# Patient Record
Sex: Female | Born: 1955 | Race: Black or African American | Hispanic: No | Smoking: Current every day smoker
Health system: Southern US, Community
[De-identification: ages and names within clinical notes are randomized; demographics above are authoritative.]

## PROBLEM LIST (undated history)

## (undated) DIAGNOSIS — D696 Thrombocytopenia, unspecified: Secondary | ICD-10-CM

## (undated) HISTORY — PX: ABDOMINAL HYSTERECTOMY: SHX81

## (undated) HISTORY — DX: Thrombocytopenia, unspecified: D69.6

## (undated) HISTORY — PX: SALPINGECTOMY: SHX328

---

## 2004-05-19 ENCOUNTER — Emergency Department (HOSPITAL_COMMUNITY): Admission: EM | Admit: 2004-05-19 | Discharge: 2004-05-19 | Payer: Self-pay | Admitting: *Deleted

## 2005-09-15 ENCOUNTER — Ambulatory Visit: Payer: Self-pay

## 2005-11-09 DIAGNOSIS — D6949 Other primary thrombocytopenia: Secondary | ICD-10-CM | POA: Insufficient documentation

## 2006-11-03 ENCOUNTER — Ambulatory Visit: Payer: Self-pay | Admitting: Family Medicine

## 2006-11-14 ENCOUNTER — Ambulatory Visit: Payer: Self-pay

## 2006-11-18 DIAGNOSIS — R079 Chest pain, unspecified: Secondary | ICD-10-CM | POA: Insufficient documentation

## 2010-09-02 LAB — CBC AND DIFFERENTIAL
HCT: 38 % (ref 36–46)
HEMOGLOBIN: 13.4 g/dL (ref 12.0–16.0)
Platelets: 148 10*3/uL — AB (ref 150–399)
WBC: 4.5 10*3/mL

## 2010-09-02 LAB — BASIC METABOLIC PANEL
BUN: 12 mg/dL (ref 4–21)
Creatinine: 0.7 mg/dL (ref 0.5–1.1)
Glucose: 93 mg/dL
Potassium: 4.1 mmol/L (ref 3.4–5.3)
Sodium: 140 mmol/L (ref 137–147)

## 2010-09-02 LAB — HEPATIC FUNCTION PANEL
ALT: 19 U/L (ref 7–35)
AST: 18 U/L (ref 13–35)

## 2010-09-02 LAB — TSH: TSH: 1.2 u[IU]/mL (ref 0.41–5.90)

## 2010-10-08 ENCOUNTER — Ambulatory Visit: Payer: Self-pay | Admitting: Family Medicine

## 2010-10-15 ENCOUNTER — Ambulatory Visit: Payer: Self-pay | Admitting: Family Medicine

## 2010-10-22 ENCOUNTER — Ambulatory Visit: Payer: Self-pay | Admitting: Gastroenterology

## 2010-10-22 LAB — HM COLONOSCOPY: HM Colonoscopy: NORMAL

## 2011-04-22 ENCOUNTER — Ambulatory Visit: Payer: Self-pay | Admitting: Family Medicine

## 2011-10-12 ENCOUNTER — Ambulatory Visit: Payer: Self-pay | Admitting: Family Medicine

## 2012-06-27 ENCOUNTER — Ambulatory Visit: Payer: Self-pay | Admitting: Family Medicine

## 2012-07-18 ENCOUNTER — Ambulatory Visit: Payer: Self-pay | Admitting: Family Medicine

## 2012-12-21 ENCOUNTER — Ambulatory Visit: Payer: Self-pay | Admitting: Family Medicine

## 2012-12-27 ENCOUNTER — Encounter: Payer: Self-pay | Admitting: *Deleted

## 2013-01-15 ENCOUNTER — Encounter: Payer: Self-pay | Admitting: General Surgery

## 2013-01-15 ENCOUNTER — Ambulatory Visit (INDEPENDENT_AMBULATORY_CARE_PROVIDER_SITE_OTHER): Payer: BC Managed Care – PPO | Admitting: General Surgery

## 2013-01-15 VITALS — BP 118/69 | HR 76 | Resp 12 | Ht 66.0 in | Wt 116.0 lb

## 2013-01-15 DIAGNOSIS — R928 Other abnormal and inconclusive findings on diagnostic imaging of breast: Secondary | ICD-10-CM | POA: Insufficient documentation

## 2013-01-15 DIAGNOSIS — N644 Mastodynia: Secondary | ICD-10-CM

## 2013-01-15 NOTE — Patient Instructions (Addendum)
Patient may take  advil two times daily for an month and call us to let us know how you are doing.

## 2013-01-15 NOTE — Progress Notes (Signed)
Patient ID: Kayla Allen, female   DOB: 1956/04/07, 57 y.o.   MRN: [id number]  Chief Complaint  Patient presents with  . Obesity    left breast     HPI Kayla Allen is a 57 y.o. female who presents for a breast evaluation. The most recent mammogram was done on 12/21/12 Patient does perform regular self breast checks and gets regular mammograms done. She states she is experiencing a throbbing, stabbing pain in the left breast in the lower inner quadrant. No clear relation to activity. The patient was last seen in December 2012 with at that time an 18 month history of a burning sensation involving the entire left breast.  Cystic lesions were also identified and the breasts at that time, primarily in the 1:00-4:00 position.  HPI  History reviewed. No pertinent past medical history.  Past Surgical History  Procedure Laterality Date  . Abdominal hysterectomy      Family History  Problem Relation Age of Onset  . Breast cancer Maternal Aunt     Social History History  Substance Use Topics  . Smoking status: Current Every Day Smoker -- 1.00 packs/day for 15 years  . Smokeless tobacco: Never Used  . Alcohol Use: Yes    Allergies  Allergen Reactions  . Penicillins Hives    Current Outpatient Prescriptions  Medication Sig Dispense Refill  . BLACK COHOSH EXTRACT PO Take 1 tablet by mouth daily.      . Multiple Vitamins-Minerals (MULTIVITAMIN WITH MINERALS) tablet Take 1 tablet by mouth daily.       No current facility-administered medications for this visit.    Review of Systems Review of Systems  Constitutional: Negative.   Respiratory: Negative.   Cardiovascular: Negative.     Blood pressure 118/69, pulse 76, resp. rate 12, height 5\' 6"  (1.676 m), weight 116 lb (52.617 kg).  Physical Exam Physical Exam  Vitals reviewed. Constitutional: She is oriented to person, place, and time. She appears well-developed and well-nourished.  Cardiovascular: Normal rate and  normal heart sounds.   Pulmonary/Chest: Breath sounds normal. Right breast exhibits no inverted nipple, no mass, no nipple discharge, no skin change and no tenderness. Left breast exhibits tenderness. Left breast exhibits no inverted nipple, no mass, no nipple discharge and no skin change.  Tenderness on the left costochondral cartilage area 4-6 on the left side. This appeared to reproduce her pain. No pain with AP or lateral chest wall compression.  Lymphadenopathy:    She has no cervical adenopathy.    She has no axillary adenopathy.  Neurological: She is oriented to person, place, and time.  Skin: Skin is warm.    Data Reviewed Bilateral mammograms December 21, 2012 showed a dense breast pattern consistent with her thin body habitus. No abnormality was appreciated in the medial aspect of the breast were a marker had been placed.  Ultrasound examination of the 4:00 position 4 cm from nipple showed a cystic structure measuring 0.5 x 0.9 x 1.4 cm and of the 3:30 o'clock position 2 cm from nipple was 0.6 x 0.6 x 0.9 cm hypoechoic structure. Neither lesion showed posterior acoustic shadowing.  Her December 2012 office ultrasound at the 5:00 position had showed a bilobed area with acoustic enhancement measuring 0.3 x 0.3 x 0.4 cm and 0.6 x 0.65 x 0.7 cm. This was unchanged from an earlier exam in May 2003. At the 4:00 position 2 simple cysts were identified measuring 0.8 and 1.0 cm, 4 cm from the nipple. This  likely corresponds to the recently noted ultrasound abnormality.  Assessment    Mastalgia/chest wall discomfort.     Plan    IOC evidence of a primary breast pathology. The patient has been asked to make use of Aleve, one tablet b.i.d. For the next month. She is asked uroflow follow up at that time.  I anticipate she'll experience improvement in her discomfort. We'll arrange for a followup wall this ultrasound in 6 months to confirm stability of the previously identified left lower outer  quadrant lesions.        Earline Mayotte 01/15/2013, 7:57 PM

## 2013-07-10 ENCOUNTER — Ambulatory Visit: Payer: BC Managed Care – PPO | Admitting: General Surgery

## 2013-07-12 ENCOUNTER — Ambulatory Visit (INDEPENDENT_AMBULATORY_CARE_PROVIDER_SITE_OTHER): Payer: BC Managed Care – PPO | Admitting: General Surgery

## 2013-07-12 ENCOUNTER — Other Ambulatory Visit: Payer: BC Managed Care – PPO

## 2013-07-12 VITALS — BP 118/70 | HR 76 | Resp 14 | Ht 66.0 in | Wt 115.0 lb

## 2013-07-12 DIAGNOSIS — N63 Unspecified lump in unspecified breast: Secondary | ICD-10-CM

## 2013-07-12 DIAGNOSIS — N6009 Solitary cyst of unspecified breast: Secondary | ICD-10-CM

## 2013-07-12 NOTE — Progress Notes (Signed)
Patient ID: Kayla Allen, female   DOB: 02/14/1956, 58 y.o.   MRN: [id number]  Chief Complaint  Patient presents with  . Follow-up    breast ulrasound    HPI Kayla Allen is a 58 y.o. female here today for an left breast ulrasound.Patient states she has been having sharp pains in her left breast, intermittent without a clear relation to activity.  HPI  No past medical history on file.  Past Surgical History  Procedure Laterality Date  . Abdominal hysterectomy      Family History  Problem Relation Age of Onset  . Breast cancer Maternal Aunt     Social History History  Substance Use Topics  . Smoking status: Current Every Day Smoker -- 1.00 packs/day for 15 years  . Smokeless tobacco: Never Used  . Alcohol Use: Yes    Allergies  Allergen Reactions  . Penicillins Hives    Current Outpatient Prescriptions  Medication Sig Dispense Refill  . BLACK COHOSH EXTRACT PO Take 1 tablet by mouth daily.      . Multiple Vitamins-Minerals (MULTIVITAMIN WITH MINERALS) tablet Take 1 tablet by mouth daily.       No current facility-administered medications for this visit.    Review of Systems Review of Systems  Constitutional: Negative.   Respiratory: Negative.   Cardiovascular: Negative.     Blood pressure 118/70, pulse 76, resp. rate 14, height 5\' 6"  (1.676 m), weight 115 lb (52.164 kg).  Physical Exam Physical Exam  Constitutional: She is oriented to person, place, and time. She appears well-developed and well-nourished.  Eyes: Conjunctivae are normal.  Neck: Neck supple.  5 mm skin cyst left neck   Cardiovascular: Normal rate, regular rhythm and normal heart sounds.   Pulmonary/Chest: Breath sounds normal. Wheezes:  bilateral  Right breast exhibits no inverted nipple, no mass, no nipple discharge, no skin change and no tenderness. Left breast exhibits no inverted nipple, no mass, no nipple discharge, no skin change and no tenderness.  Lymphadenopathy:    She has  no cervical adenopathy.    She has no axillary adenopathy.  Neurological: She is alert and oriented to person, place, and time.  Skin: Skin is warm and dry.    Data Reviewed Previous ultrasound in July 2014 showed a 0.6 x 0.6 x 0.9 cm hypoechoic nodule with internal echoes and the 3:30 o'clock position the left breast. At the 4:00 position a 0.5 x 0.9 x 1.4 cm anechoic structure was identified. Examination today showed multiple anechoic lesions throughout the left breast. At the 3:00 position 1 cm from the nipple a 0.3 x 0.6 x 0.8 cm anechoic structure with an acoustic enhancement was noted. At the 3:00 position 6 cm from nipple a 0.5 x 1.0 x 1.04 cm anechoic structure with marked enhancement was identified. At the 4:00 position a 0.6 x 0.8 cm cystic structure was identified. At the 6:00 position multiple small anechoic structures were appreciated. At the 3:00 position, 5 cm from the nipple and 0.6 x 0.6 x 0.7 cm hypoechoic lesion with strong acoustic shadowing and sharp edge effect was appreciated. This area had not been previously reported but may be representative of a degenerating cyst.  Assessment    Mastalgia versus chest wall pain. Multiple breast cysts. Noninflamed skin cyst of the left posterior lateral neck.     Plan    Continued observation is warranted. Arrangements will be made for screening mammograms in July 2015 with an office visit to follow.  The  patient was encouraged to consider the amount of her caffeine use which may be contributing to her breast discomfort.       Earline MayotteByrnett, Tharon Kitch W 07/12/2013, 8:35 PM

## 2013-07-12 NOTE — Patient Instructions (Addendum)
Patient to return in July 2015 bilateral screening mammogram. Patient may take tylenol, advil, or aleve as needed for pain or discomfort.

## 2013-12-26 ENCOUNTER — Encounter: Payer: Self-pay | Admitting: General Surgery

## 2014-01-01 ENCOUNTER — Ambulatory Visit: Payer: BC Managed Care – PPO | Admitting: General Surgery

## 2014-01-29 ENCOUNTER — Encounter: Payer: Self-pay | Admitting: *Deleted

## 2015-04-09 DIAGNOSIS — F17210 Nicotine dependence, cigarettes, uncomplicated: Secondary | ICD-10-CM | POA: Insufficient documentation

## 2015-04-11 ENCOUNTER — Ambulatory Visit (INDEPENDENT_AMBULATORY_CARE_PROVIDER_SITE_OTHER): Payer: 59 | Admitting: Family Medicine

## 2015-04-11 ENCOUNTER — Encounter: Payer: Self-pay | Admitting: Family Medicine

## 2015-04-11 VITALS — BP 120/84 | HR 70 | Temp 98.8°F | Resp 16 | Ht 66.0 in | Wt 123.0 lb

## 2015-04-11 DIAGNOSIS — Z1159 Encounter for screening for other viral diseases: Secondary | ICD-10-CM

## 2015-04-11 DIAGNOSIS — Z720 Tobacco use: Secondary | ICD-10-CM | POA: Diagnosis not present

## 2015-04-11 DIAGNOSIS — N6002 Solitary cyst of left breast: Secondary | ICD-10-CM

## 2015-04-11 DIAGNOSIS — R5383 Other fatigue: Secondary | ICD-10-CM

## 2015-04-11 DIAGNOSIS — Z01419 Encounter for gynecological examination (general) (routine) without abnormal findings: Secondary | ICD-10-CM | POA: Diagnosis not present

## 2015-04-11 DIAGNOSIS — Z23 Encounter for immunization: Secondary | ICD-10-CM | POA: Diagnosis not present

## 2015-04-11 DIAGNOSIS — R928 Other abnormal and inconclusive findings on diagnostic imaging of breast: Secondary | ICD-10-CM

## 2015-04-11 DIAGNOSIS — N760 Acute vaginitis: Secondary | ICD-10-CM | POA: Diagnosis not present

## 2015-04-11 DIAGNOSIS — R300 Dysuria: Secondary | ICD-10-CM | POA: Diagnosis not present

## 2015-04-11 MED ORDER — BUPROPION HCL ER (SR) 150 MG PO TB12: ORAL_TABLET | ORAL | Status: DC

## 2015-04-11 MED ORDER — METRONIDAZOLE 500 MG PO TABS: 500.0000 mg | ORAL_TABLET | Freq: Two times a day (BID) | ORAL | Status: AC

## 2015-04-11 NOTE — Progress Notes (Signed)
Patient: Kayla Allen, Female    DOB: 02-23-1956, 59 y.o.   MRN: [id number] Visit Date: 04/11/2015  Today's Provider: Mila Merry, MD   Chief Complaint  Patient presents with  . Annual Exam   Subjective:    Annual physical exam Kayla Allen is a 59 y.o. female who presents today for health maintenance and complete physical. She feels well. She reports exercising yes/walking. She reports she is sleeping well.  -----------------------------------------------------------------   Vaginal burning. Complains 1-2 weeks of light vaginal discharge and burning, worse with urination.    Review of Systems  Constitutional: Positive for fatigue and unexpected weight change.  HENT: Negative.   Eyes: Negative.   Respiratory: Negative.   Cardiovascular: Negative.   Gastrointestinal: Positive for constipation and abdominal distention.  Genitourinary: Negative.   Musculoskeletal: Positive for back pain.  Skin: Negative.   Allergic/Immunologic: Negative.   Neurological: Negative.   Hematological: Negative.   Psychiatric/Behavioral: Negative.     Social History She  reports that she has been smoking.  She has never used smokeless tobacco. She reports that she drinks alcohol. She reports that she does not use illicit drugs. Social History   Social History  . Marital Status: Widowed    Spouse Name: N/A  . Number of Children: N/A  . Years of Education: N/A   Occupational History  . Works at [employer]    Social History Main Topics  . Smoking status: Current Every Day Smoker -- 1.00 packs/day for 15 years  . Smokeless tobacco: Never Used  . Alcohol Use: Yes  . Drug Use: No  . Sexual Activity: Not Asked   Other Topics Concern  . None   Social History Narrative    Patient Active Problem List   Diagnosis Date Noted  . Tobacco abuse 04/09/2015  . Breast cyst 07/12/2013  . Mastalgia 01/15/2013  . Abnormal mammogram 01/15/2013  . Chest pain  11/18/2006  . Primary thrombocytopenia (HCC) 11/09/2005    Past Surgical History  Procedure Laterality Date  . Abdominal hysterectomy    . Cesarean section      Family History  Family Status  Relation Status Death Age  . Mother Alive   . Father Deceased 28    lung cancer  . Sister Alive   . Brother Alive   . Son Alive   . Maternal Grandmother Alive   . Maternal Grandfather Deceased   . Paternal Grandfather Deceased   . Sister Alive   . Sister Alive   . Brother Alive   . Brother Alive    Her family history includes Asthma in her son; Breast cancer in her maternal aunt; Congestive Heart Failure in her maternal grandfather; Diabetes in her maternal grandmother, mother, and sister; Hypertension in her mother; Hypothyroidism in her sister; Lung cancer in her father; Prostate cancer in her paternal grandfather.    Allergies  Allergen Reactions  . Penicillins Hives    Previous Medications   BLACK COHOSH EXTRACT PO    Take 1 tablet by mouth daily.   MULTIPLE VITAMINS-MINERALS (MULTIVITAMIN WITH MINERALS) TABLET    Take 1 tablet by mouth daily.    Patient Care Team: Malva Limes, MD as PCP - General (Family Medicine) Earline Mayotte, MD (General Surgery) Malva Limes, MD as Referring Physician (Family Medicine)     Objective:   Vitals: BP 120/84 mmHg  Pulse 70  Temp(Src) 98.8 F (37.1 C) (Oral)  Resp 16  Ht 5\' 6"  (1.676 m)  Wt 123 lb (55.792 kg)  BMI 19.86 kg/m2  SpO2 100%   Physical Exam   General Appearance:    Alert, cooperative, no distress, appears stated age  Head:    Normocephalic, without obvious abnormality, atraumatic  Eyes:    PERRL, conjunctiva/corneas clear, EOM's intact, fundi    benign, both eyes  Ears:    Normal TM's and external ear canals, both ears  Nose:   Nares normal, septum midline, mucosa normal, no drainage    or sinus tenderness  Throat:   Lips, mucosa, and tongue normal; teeth and gums normal  Neck:   Supple, symmetrical,  trachea midline, no adenopathy;    thyroid:  no enlargement/tenderness/nodules; no carotid   bruit or JVD  Back:     Symmetric, no curvature, ROM normal, no CVA tenderness  Lungs:     Clear to auscultation bilaterally, respirations unlabored  Chest Wall:    No tenderness or deformity   Heart:    Regular rate and rhythm, S1 and S2 normal, no murmur, rub   or gallop  Breast Exam:    normal appearance, no masses or tenderness  Abdomen:     Soft, non-tender, bowel sounds active all four quadrants,    no masses, no organomegaly  Pelvic:    cervix normal in appearance, external genitalia normal and small amount yellow discharge  Extremities:   Extremities normal, atraumatic, no cyanosis or edema  Pulses:   2+ and symmetric all extremities  Skin:   Skin color, texture, turgor normal, no rashes or lesions  Lymph nodes:   Cervical, supraclavicular, and axillary nodes normal  Neurologic:   CNII-XII intact, normal strength, sensation and reflexes    throughout    Depression Screen PHQ 2/9 Scores 04/11/2015  PHQ - 2 Score 1  PHQ- 9 Score 10       Assessment & Plan:     Routine Health Maintenance and Physical Exam  Exercise Activities and Dietary recommendations Goals    None         Discussed health benefits of physical activity, and encouraged her to engage in regular exercise appropriate for her age and condition.    --------------------------------------------------------------------  1. Well woman exam with routine gynecological exam - Zostavax given today. Patient states she has check her insurance and that it is covered. She wants to go ahead and get vaccine now because she has had chicken pox twice.  - Lipid panel - Pap IG, CT/NG w/ reflex HPV when ASC-U  2. Tobacco abuse Discussed health benefits of smoking cessation. She is going to consider trying Zyban. Given printed rx today.  - buPROPion (WELLBUTRIN SR) 150 MG 12 hr tablet; 1 tablet daily for 3 days, then 1 tablet  twice daily. Stop smoking 14 days after starting medication  Dispense: 60 tablet; Refill: 3  3. Need for Tdap vaccination She refused Tdap. She thinks she had one in 2012.   4. Other fatigue  - CBC - Comprehensive metabolic panel - TSH  5. Need for hepatitis C screening test  - Hepatitis C antibody  6. Dysuria  - POCT urinalysis dipstick  7. Vaginitis Treat empirically to cover BV and trich while waiting for pap results.  - metroNIDAZOLE (FLAGYL) 500 MG tablet; Take 1 tablet (500 mg total) by mouth 2 (two) times daily.  Dispense: 14 tablet; Refill: 0

## 2015-04-12 LAB — COMPREHENSIVE METABOLIC PANEL
A/G RATIO: 1.8 (ref 1.1–2.5)
ALBUMIN: 4.9 g/dL (ref 3.5–5.5)
ALK PHOS: 111 IU/L (ref 39–117)
ALT: 21 IU/L (ref 0–32)
AST: 21 IU/L (ref 0–40)
BUN/Creatinine Ratio: 14 (ref 9–23)
BUN: 11 mg/dL (ref 6–24)
Bilirubin Total: 0.5 mg/dL (ref 0.0–1.2)
CO2: 26 mmol/L (ref 18–29)
Calcium: 9.4 mg/dL (ref 8.7–10.2)
Chloride: 102 mmol/L (ref 97–106)
Creatinine, Ser: 0.76 mg/dL (ref 0.57–1.00)
GFR calc Af Amer: 101 mL/min/{1.73_m2} (ref >59)
GFR calc non Af Amer: 88 mL/min/{1.73_m2} (ref >59)
Globulin, Total: 2.7 g/dL (ref 1.5–4.5)
Glucose: 66 mg/dL (ref 65–99)
POTASSIUM: 4 mmol/L (ref 3.5–5.2)
Sodium: 143 mmol/L (ref 136–144)
Total Protein: 7.6 g/dL (ref 6.0–8.5)

## 2015-04-12 LAB — CBC
Hematocrit: 40.8 % (ref 34.0–46.6)
Hemoglobin: 13.3 g/dL (ref 11.1–15.9)
MCH: 28 pg (ref 26.6–33.0)
MCHC: 32.6 g/dL (ref 31.5–35.7)
MCV: 86 fL (ref 79–97)
Platelets: 146 10*3/uL — ABNORMAL LOW (ref 150–379)
RBC: 4.75 x10E6/uL (ref 3.77–5.28)
RDW: 13.4 % (ref 12.3–15.4)
WBC: 5.3 10*3/uL (ref 3.4–10.8)

## 2015-04-12 LAB — LIPID PANEL
Chol/HDL Ratio: 2.8 ratio units (ref 0.0–4.4)
Cholesterol, Total: 152 mg/dL (ref 100–199)
HDL: 54 mg/dL (ref >39)
LDL CALC: 84 mg/dL (ref 0–99)
Triglycerides: 68 mg/dL (ref 0–149)
VLDL Cholesterol Cal: 14 mg/dL (ref 5–40)

## 2015-04-12 LAB — HEPATITIS C ANTIBODY

## 2015-04-12 LAB — TSH: TSH: 1.24 u[IU]/mL (ref 0.450–4.500)

## 2015-04-15 LAB — PAP IG, CT-NG, RFX HPV ASCU
Chlamydia, Nuc. Acid Amp: NEGATIVE
Gonococcus by Nucleic Acid Amp: NEGATIVE
PAP Smear Comment: 0

## 2015-04-17 ENCOUNTER — Telehealth: Payer: Self-pay | Admitting: Family Medicine

## 2015-04-17 NOTE — Telephone Encounter (Signed)
Pt has an order in [facility] for a screening mammogram and also for left breast diagnostic.Can you clarify which one of these orders is correct ?

## 2015-04-17 NOTE — Telephone Encounter (Signed)
She needs a diagnostic mammogram on the left due to history of breast nodules on that side. She needs a screening on the right. I don't know how they want it ordered.

## 2015-04-21 NOTE — Telephone Encounter (Signed)
[facility] states pt just needs screening mammogram

## 2015-04-28 ENCOUNTER — Telehealth: Payer: Self-pay | Admitting: Family Medicine

## 2015-04-28 NOTE — Telephone Encounter (Signed)
Patient states that you gave her a prescription for metronidazole (Flagyl) at her office visit on 04/11/2015.  She states that she is still having symptoms and would like to know if you would [facility].   CB#[phone] jld

## 2015-04-29 MED ORDER — CIPROFLOXACIN HCL 500 MG PO TABS: 500.0000 mg | ORAL_TABLET | Freq: Two times a day (BID) | ORAL | Status: AC

## 2015-04-29 NOTE — Telephone Encounter (Signed)
[facility]. If still having discharge she should also use OTC Gyne lotrimen. Call if not better when finished,

## 2016-08-13 ENCOUNTER — Ambulatory Visit (INDEPENDENT_AMBULATORY_CARE_PROVIDER_SITE_OTHER): Payer: 59 | Admitting: Family Medicine

## 2016-08-13 ENCOUNTER — Encounter: Payer: Self-pay | Admitting: Family Medicine

## 2016-08-13 VITALS — BP 118/80 | HR 78 | Temp 98.2°F | Resp 16 | Ht 66.0 in | Wt 125.0 lb

## 2016-08-13 DIAGNOSIS — Z23 Encounter for immunization: Secondary | ICD-10-CM | POA: Diagnosis not present

## 2016-08-13 DIAGNOSIS — Z0000 Encounter for general adult medical examination without abnormal findings: Secondary | ICD-10-CM | POA: Diagnosis not present

## 2016-08-13 DIAGNOSIS — F17210 Nicotine dependence, cigarettes, uncomplicated: Secondary | ICD-10-CM | POA: Diagnosis not present

## 2016-08-13 DIAGNOSIS — L84 Corns and callosities: Secondary | ICD-10-CM

## 2016-08-13 NOTE — Progress Notes (Signed)
Patient: Kayla Allen, Female    DOB: [birth year], 61 y.o.   MRN: [id number] Visit Date: 08/13/2016  Today's Provider: Mila Merry, MD   Chief Complaint  Patient presents with  . Annual Exam  . Nicotine Dependence   Subjective:    Annual physical exam Kayla Allen is a 61 y.o. female who presents today for health maintenance and complete physical. She feels fairly well. She reports exercising daily. She reports she is sleeping fairly well.  ----------------------------------------------------------------- Tobacco Abuse:  Patient was last seen for this problem almost 2 years ago. Since that visit, patient states she has cut back to 1/2 ppd.   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   Review of Systems  Constitutional: Positive for activity change and appetite change. Negative for chills, fatigue and fever.  HENT: Negative for congestion, ear pain, rhinorrhea, sneezing and sore throat.   Eyes: Negative.  Negative for pain and redness.  Respiratory: Negative for cough, shortness of breath and wheezing.   Cardiovascular: Negative for chest pain and leg swelling.  Gastrointestinal: Positive for abdominal distention. Negative for abdominal pain, blood in stool, constipation, diarrhea and nausea.  Endocrine: Negative for polydipsia and polyphagia.  Genitourinary: Negative.  Negative for dysuria, flank pain, hematuria, pelvic pain, vaginal bleeding and vaginal discharge.  Musculoskeletal: Negative for arthralgias, back pain, gait problem and joint swelling.  Skin: Negative for rash.       Lesion of left foot  Neurological: Negative.  Negative for dizziness, tremors, seizures, weakness, light-headedness, numbness and headaches.  Hematological: Negative for adenopathy.  Psychiatric/Behavioral: Negative.  Negative for behavioral problems, confusion and dysphoric mood.  The patient is not nervous/anxious and is not hyperactive.     Social History      She  reports that she has been smoking.  She has a 75.00 pack-year smoking history. She has never used smokeless tobacco. She reports that she drinks alcohol. She reports that she does not use drugs.       Social History   Social History  . Marital status: Widowed    Spouse name: N/A  . Number of children: 1  . Years of education: N/A   Occupational History  . [employer]    Social History Main Topics  . Smoking status: Current Every Day Smoker    Packs/day: 1.00    Years: 35.00    Types: Cigarettes  . Smokeless tobacco: Never Used     Comment: started smoking at age 86; was smoking 1ppd for several years, but has recently cut back to 1/2 ppd  . Alcohol use Yes     Comment: drinks once a month  . Drug use: No  . Sexual activity: Not Asked   Other Topics Concern  . None   Social History Narrative  . None    No past medical history on file.   Patient Active Problem List   Diagnosis Date Noted  . Tobacco abuse 04/09/2015  . Breast cyst 07/12/2013  . Mastalgia 01/15/2013  . Abnormal mammogram 01/15/2013  . Chest pain 11/18/2006  . Primary thrombocytopenia (HCC) 11/09/2005    Past Surgical History:  Procedure Laterality Date  . ABDOMINAL HYSTERECTOMY     partial; right fallopian tube removed due scar tissue  . CESAREAN SECTION      Family History        Family  Status  Relation Status  . Mother Alive  . Father Deceased at age 60   lung cancer  . Sister Alive  . Brother Alive  . Son Alive  . Maternal Grandmother Alive  . Maternal Grandfather Deceased  . Paternal Grandfather Deceased  . Sister Alive  . Sister Alive  . Brother Alive  . Brother Alive  . Maternal Aunt         Her family history includes Asthma in her son; Breast cancer in her maternal aunt; Congestive Heart Failure in her maternal grandfather; Diabetes in her maternal grandmother, mother,  sister, and son; Hypertension in her mother; Hypothyroidism in her sister; Lung cancer in her father; Prostate cancer in her paternal grandfather.     Allergies  Allergen Reactions  . Penicillins Hives     Current Outpatient Prescriptions:  .  BLACK COHOSH EXTRACT PO, Take 1 tablet by mouth daily., Disp: , Rfl:  .  Multiple Vitamins-Minerals (MULTIVITAMIN WITH MINERALS) tablet, Take 1 tablet by mouth daily., Disp: , Rfl:    Patient Care Team: Malva Limes, MD as PCP - General (Family Medicine) Earline Mayotte, MD (General Surgery) Malva Limes, MD as Referring Physician (Family Medicine)      Objective:   Vitals: BP 118/80 (BP Location: Left Arm, Patient Position: Sitting, Cuff Size: Normal)   Pulse 78   Temp 98.2 F (36.8 C) (Oral)   Resp 16   Ht 5\' 6"  (1.676 m)   Wt 125 lb (56.7 kg)   SpO2 95% Comment: room air  BMI 20.18 kg/m   There were no vitals filed for this visit.   Physical Exam  General Appearance:    Alert, cooperative, no distress, appears stated age  Head:    Normocephalic, without obvious abnormality, atraumatic  Eyes:    PERRL, conjunctiva/corneas clear, EOM's intact, fundi    benign, both eyes  Ears:    Normal TM's and external ear canals, both ears  Nose:   Nares normal, septum midline, mucosa normal, no drainage    or sinus tenderness  Throat:   Lips, mucosa, and tongue normal; teeth and gums normal  Neck:   Supple, symmetrical, trachea midline, no adenopathy;    thyroid:  no enlargement/tenderness/nodules; no carotid   bruit or JVD  Back:     Symmetric, no curvature, ROM normal, no CVA tenderness  Lungs:     Clear to auscultation bilaterally, respirations unlabored  Chest Wall:    No tenderness or deformity   Heart:    Regular rate and rhythm, S1 and S2 normal, no murmur, rub   or gallop  Breast Exam:    normal appearance, no masses or tenderness  Abdomen:     Soft, non-tender, bowel sounds active all four quadrants,    no masses, no  organomegaly  Pelvic:    deferred  Extremities:   Extremities normal, atraumatic, no cyanosis or edema  Pulses:   2+ and symmetric all extremities  Skin:   Skin color, texture, turgor normal, no rashes or lesions  Lymph nodes:   Cervical, supraclavicular, and axillary nodes normal  Neurologic:   CNII-XII intact, normal strength, sensation and reflexes    throughout    Depression Screen PHQ 2/9 Scores 08/13/2016 04/11/2015  PHQ - 2 Score 2 1  PHQ- 9 Score 9 10      Assessment & Plan:     Routine Health Maintenance and Physical Exam  Exercise Activities and Dietary recommendations Goals    None  Immunization History  Administered Date(s) Administered  . Zoster 04/11/2015    Health Maintenance  Topic Date Due  . HIV Screening  05/10/1973  . TETANUS/TDAP  05/10/1977  . MAMMOGRAM  12/26/2015  . INFLUENZA VACCINE  01/06/2016  . PAP SMEAR  04/10/2018  . COLONOSCOPY  10/21/2020  . Hepatitis C Screening  Completed     Discussed health benefits of physical activity, and encouraged her to engage in regular exercise appropriate for her age and condition.    --------------------------------------------------------------------  1. Annual physical exam  - Lipid panel - Comprehensive Metabolic Panel (CMET) - CBC  2. Need for Tdap vaccination  - Tdap vaccine greater than or equal to 7yo IM  3. Callus of foot  - Ambulatory referral to Podiatry  4. Smoking greater than 30 pack years Has cut back to 1/2 ppd. Encouraged to quit - CT CHEST LUNG CANCER SCREENING LOW DOSE WO CONTRAST; Future   Mila Merryonald Fisher, MD  [facility]

## 2016-08-16 DIAGNOSIS — Z0000 Encounter for general adult medical examination without abnormal findings: Secondary | ICD-10-CM | POA: Diagnosis not present

## 2016-08-17 ENCOUNTER — Telehealth: Payer: Self-pay

## 2016-08-17 ENCOUNTER — Telehealth: Payer: Self-pay | Admitting: *Deleted

## 2016-08-17 LAB — LIPID PANEL
CHOLESTEROL TOTAL: 140 mg/dL (ref 100–199)
Chol/HDL Ratio: 2.7 ratio units (ref 0.0–4.4)
HDL: 51 mg/dL (ref >39)
LDL CALC: 71 mg/dL (ref 0–99)
TRIGLYCERIDES: 88 mg/dL (ref 0–149)
VLDL Cholesterol Cal: 18 mg/dL (ref 5–40)

## 2016-08-17 LAB — COMPREHENSIVE METABOLIC PANEL
A/G RATIO: 1.7 (ref 1.2–2.2)
ALT: 23 IU/L (ref 0–32)
AST: 20 IU/L (ref 0–40)
Albumin: 4.6 g/dL (ref 3.5–5.5)
Alkaline Phosphatase: 115 IU/L (ref 39–117)
BUN/Creatinine Ratio: 19 (ref 9–23)
BUN: 12 mg/dL (ref 6–24)
Bilirubin Total: 0.4 mg/dL (ref 0.0–1.2)
CO2: 28 mmol/L (ref 18–29)
Calcium: 9.4 mg/dL (ref 8.7–10.2)
Chloride: 100 mmol/L (ref 96–106)
Creatinine, Ser: 0.64 mg/dL (ref 0.57–1.00)
GFR calc Af Amer: 114 mL/min/{1.73_m2} (ref >59)
GFR calc non Af Amer: 99 mL/min/{1.73_m2} (ref >59)
Globulin, Total: 2.7 g/dL (ref 1.5–4.5)
Glucose: 104 mg/dL — ABNORMAL HIGH (ref 65–99)
POTASSIUM: 4.2 mmol/L (ref 3.5–5.2)
Sodium: 143 mmol/L (ref 134–144)
Total Protein: 7.3 g/dL (ref 6.0–8.5)

## 2016-08-17 LAB — CBC
Hematocrit: 39.2 % (ref 34.0–46.6)
Hemoglobin: 12.2 g/dL (ref 11.1–15.9)
MCH: 26.5 pg — AB (ref 26.6–33.0)
MCHC: 31.1 g/dL — ABNORMAL LOW (ref 31.5–35.7)
MCV: 85 fL (ref 79–97)
PLATELETS: 147 10*3/uL — AB (ref 150–379)
RBC: 4.6 x10E6/uL (ref 3.77–5.28)
RDW: 13.8 % (ref 12.3–15.4)
WBC: 6.5 10*3/uL (ref 3.4–10.8)

## 2016-08-17 NOTE — Telephone Encounter (Signed)
Tried calling patient but no answer. VMbox was full. Will try again later.

## 2016-08-17 NOTE — Telephone Encounter (Signed)
-----   Message from Malva Limesonald E Fisher, MD sent at 08/17/2016  8:45 AM EDT ----- Blood sugar, kidney functions, electrolytes and cholesterol are all normal. Check labs yearly.

## 2016-08-17 NOTE — Telephone Encounter (Signed)
Received referral for low dose lung cancer screening CT scan. Attempted to leave voicemail at phone number listed in EMR for patient to call me back to facilitate scheduling scan. However, there is no voicemail option available. I will attempt to contact at a later date.

## 2016-08-18 ENCOUNTER — Telehealth: Payer: Self-pay | Admitting: *Deleted

## 2016-08-18 NOTE — Telephone Encounter (Signed)
Received referral for low dose lung cancer screening CT scan. Attempted to leave voicemail left at phone number listed in EMR for patient to call me back to facilitate scheduling scan. However, no voicemail option is available. A letter will be mailed to patient.

## 2016-08-19 ENCOUNTER — Telehealth: Payer: Self-pay | Admitting: *Deleted

## 2016-08-19 DIAGNOSIS — Z87891 Personal history of nicotine dependence: Secondary | ICD-10-CM

## 2016-08-19 NOTE — Telephone Encounter (Signed)
Received referral for initial lung cancer screening scan. Contacted patient and obtained smoking history,(current, 35 pack year) as well as answering questions related to screening process. Patient denies signs of lung cancer such as weight loss or hemoptysis. Patient denies comorbidity that would prevent curative treatment if lung cancer were found. Patient is tentatively scheduled for shared decision making visit and CT scan on 08/30/16, pending insurance approval from business office.

## 2016-08-20 NOTE — Telephone Encounter (Signed)
Attempted to contact patient. No answer and VM was full.

## 2016-08-23 NOTE — Telephone Encounter (Signed)
Unable to contact the patient. Will save the message to chart.  

## 2016-08-23 NOTE — Telephone Encounter (Signed)
No answer [facility] to [facility].  Thanks,  -Deolinda Frid

## 2016-08-30 ENCOUNTER — Inpatient Hospital Stay: Payer: 59 | Attending: Oncology | Admitting: Oncology

## 2016-08-30 ENCOUNTER — Ambulatory Visit
Admission: RE | Admit: 2016-08-30 | Discharge: 2016-08-30 | Disposition: A | Discharge status: Home / Self Care | Payer: 59 | Source: Ambulatory Visit | Attending: Oncology | Admitting: Oncology

## 2016-08-30 DIAGNOSIS — Z122 Encounter for screening for malignant neoplasm of respiratory organs: Secondary | ICD-10-CM | POA: Insufficient documentation

## 2016-08-30 DIAGNOSIS — Z87891 Personal history of nicotine dependence: Secondary | ICD-10-CM | POA: Diagnosis not present

## 2016-08-30 NOTE — Progress Notes (Signed)
Personal history of tobacco use, presenting hazards to health In accordance with CMS guidelines, patient has met eligibility criteria including age, absence of signs or symptoms of lung cancer.  Social History  Substance Use Topics  . Smoking status: Current Every Day Smoker    Packs/day: 1.00    Years: 35.00    Types: Cigarettes  . Smokeless tobacco: Never Used     Comment: started smoking at age 61; was smoking 1ppd for several years, but has recently cut back to 1/2 ppd  . Alcohol use Yes     Comment: drinks once a month     A shared decision-making session was conducted prior to the performance of CT scan. This includes one or more decision aids, includes benefits and harms of screening, follow-up diagnostic testing, over-diagnosis, false positive rate, and total radiation exposure.  Counseling on the importance of adherence to annual lung cancer LDCT screening, impact of co-morbidities, and ability or willingness to undergo diagnosis and treatment is imperative for compliance of the program.  Counseling on the importance of continued smoking cessation for former smokers; the importance of smoking cessation for current smokers, and information about tobacco cessation interventions have been given to patient including [place] and 1800 quit [place] programs.  Written order for lung cancer screening with LDCT has been given to the patient and any and all questions have been answered to the best of my abilities.   Yearly follow up will be coordinated by Burgess Estelle, Thoracic Navigator.   Lucendia Herrlich, NP  08/30/16 2:52 PM

## 2016-08-30 NOTE — Assessment & Plan Note (Signed)
In accordance with CMS guidelines, patient has met eligibility criteria including age, absence of signs or symptoms of lung cancer.  Social History  Substance Use Topics  . Smoking status: Current Every Day Smoker    Packs/day: 1.00    Years: 35.00    Types: Cigarettes  . Smokeless tobacco: Never Used     Comment: started smoking at age 61; was smoking 1ppd for several years, but has recently cut back to 1/2 ppd  . Alcohol use Yes     Comment: drinks once a month     A shared decision-making session was conducted prior to the performance of CT scan. This includes one or more decision aids, includes benefits and harms of screening, follow-up diagnostic testing, over-diagnosis, false positive rate, and total radiation exposure.  Counseling on the importance of adherence to annual lung cancer LDCT screening, impact of co-morbidities, and ability or willingness to undergo diagnosis and treatment is imperative for compliance of the program.  Counseling on the importance of continued smoking cessation for former smokers; the importance of smoking cessation for current smokers, and information about tobacco cessation interventions have been given to patient including [place] and 1800 quit [place] programs.  Written order for lung cancer screening with LDCT has been given to the patient and any and all questions have been answered to the best of my abilities.   Yearly follow up will be coordinated by Burgess Estelle, Thoracic Navigator.

## 2016-08-31 ENCOUNTER — Encounter: Payer: Self-pay | Admitting: Podiatry

## 2016-08-31 ENCOUNTER — Ambulatory Visit (INDEPENDENT_AMBULATORY_CARE_PROVIDER_SITE_OTHER): Payer: 59 | Admitting: Podiatry

## 2016-08-31 VITALS — BP 121/66 | HR 64 | Resp 16

## 2016-08-31 DIAGNOSIS — L851 Acquired keratosis [keratoderma] palmaris et plantaris: Secondary | ICD-10-CM

## 2016-08-31 DIAGNOSIS — L84 Corns and callosities: Secondary | ICD-10-CM

## 2016-08-31 DIAGNOSIS — M2042 Other hammer toe(s) (acquired), left foot: Secondary | ICD-10-CM

## 2016-08-31 DIAGNOSIS — M79672 Pain in left foot: Secondary | ICD-10-CM | POA: Diagnosis not present

## 2016-08-31 DIAGNOSIS — M79671 Pain in right foot: Secondary | ICD-10-CM | POA: Diagnosis not present

## 2016-08-31 DIAGNOSIS — M2041 Other hammer toe(s) (acquired), right foot: Secondary | ICD-10-CM | POA: Diagnosis not present

## 2016-09-02 ENCOUNTER — Encounter: Payer: Self-pay | Admitting: *Deleted

## 2016-09-02 ENCOUNTER — Encounter: Payer: Self-pay | Admitting: Family Medicine

## 2016-09-02 DIAGNOSIS — J439 Emphysema, unspecified: Secondary | ICD-10-CM | POA: Insufficient documentation

## 2016-09-03 NOTE — Progress Notes (Signed)
   Subjective: Patient presents to the office today for chief complaint of painful callus lesions of the feet. Patient states that the pain is ongoing and is affecting their ability to ambulate without pain. Patient presents today for further treatment and evaluation.  Objective:  Physical Exam General: Alert and oriented x3 in no acute distress  Dermatology: Hyperkeratotic lesion present on the digits 4-5 of the left foot. Pain on palpation with a central nucleated core noted.  Skin is warm, dry and supple bilateral lower extremities. Negative for open lesions or macerations.  Vascular: Palpable pedal pulses bilaterally. No edema or erythema noted. Capillary refill within normal limits.  Neurological: Epicritic and protective threshold grossly intact bilaterally.   Musculoskeletal Exam: Pain on palpation at the keratotic lesion noted. Range of motion within normal limits bilateral. Muscle strength 5/5 in all groups bilateral. Hammertoe deformity also noted to the bilateral feet.  Assessment: #1 hammertoe deformity bilateral feet #2 painful pre-ulcerative callus lesions 2   Plan of Care:  #1 Patient evaluated #2 Excisional debridement of  keratoic lesion using a chisel blade was performed without incident.  #3 Treated area(s) with Salinocaine and dressed with light dressing. #4 Patient is to return to the clinic PRN.   Felecia Shelling, DPM [facility]  Dr. Felecia Shelling, DPM    [address]                                        [place], [place] [address]                Office [phone]  Fax [phone]

## 2017-07-05 ENCOUNTER — Encounter: Payer: Self-pay | Admitting: Family Medicine

## 2017-07-05 ENCOUNTER — Ambulatory Visit: Payer: 59 | Admitting: Family Medicine

## 2017-07-05 VITALS — BP 120/78 | HR 63 | Temp 98.5°F | Resp 16 | Wt 127.0 lb

## 2017-07-05 DIAGNOSIS — N63 Unspecified lump in unspecified breast: Secondary | ICD-10-CM

## 2017-07-05 DIAGNOSIS — N644 Mastodynia: Secondary | ICD-10-CM | POA: Diagnosis not present

## 2017-07-05 MED ORDER — DOXYCYCLINE HYCLATE 100 MG PO TABS: 100.0000 mg | ORAL_TABLET | Freq: Two times a day (BID) | ORAL | 0 refills | Status: DC

## 2017-07-05 MED ORDER — NAPROXEN 500 MG PO TABS: 500.0000 mg | ORAL_TABLET | Freq: Two times a day (BID) | ORAL | 0 refills | Status: DC | PRN

## 2017-07-05 NOTE — Progress Notes (Signed)
Patient: Kayla KennedyGloria A Tuohey Female    DOB: 01/22/1956   62 y.o.   MRN: [id number] Visit Date: 07/05/2017  Today's Provider: Mila Merryonald Fisher, MD   Chief Complaint  Patient presents with  . Breast Pain   Subjective:    HPI Breast Pain:   Patient comes in today complaining of worsening pain of the left breast. Pain started 6 months ago and occurs intermittently.  She says she has a history of breast cyst, and she believes the pain is coming from them. Her last mammogram was 12/25/2013 and the results showed Bi-Rads CAT 2 [facility] Patient denies any nipple discharge.     Allergies  Allergen Reactions  . Penicillins Hives     Current Outpatient Medications:  .  BLACK COHOSH EXTRACT PO, Take 1 tablet by mouth daily., Disp: , Rfl:  .  Multiple Vitamins-Minerals (MULTIVITAMIN WITH MINERALS) tablet, Take 1 tablet by mouth daily., Disp: , Rfl:   Review of Systems  Constitutional: Negative for chills, fatigue and fever.  HENT: Negative for congestion, ear pain, rhinorrhea, sneezing and sore throat.   Eyes: Negative.  Negative for pain and redness.  Respiratory: Negative for cough, shortness of breath and wheezing.   Cardiovascular: Negative for chest pain and leg swelling.  Gastrointestinal: Negative for abdominal pain, blood in stool, constipation, diarrhea and nausea.  Endocrine: Negative for polydipsia and polyphagia.  Genitourinary: Negative.  Negative for dysuria, flank pain, hematuria, pelvic pain, vaginal bleeding and vaginal discharge.       Breast pain  Musculoskeletal: Negative for arthralgias, back pain, gait problem and joint swelling.  Skin: Negative for rash.  Neurological: Negative.  Negative for dizziness, tremors, seizures, weakness, light-headedness, numbness and headaches.  Hematological: Negative for adenopathy.  Psychiatric/Behavioral: Negative.  Negative for behavioral problems, confusion and dysphoric mood. The patient is not  nervous/anxious and is not hyperactive.     Social History   Tobacco Use  . Smoking status: Current Every Day Smoker    Packs/day: 1.00    Years: 35.00    Pack years: 35.00    Types: Cigarettes  . Smokeless tobacco: Never Used  . Tobacco comment: started smoking at age 62; was smoking 1ppd for several years, but has recently cut back to 1/2 ppd  Substance Use Topics  . Alcohol use: Yes    Comment: drinks once a month   Objective:   BP 120/78 (BP Location: Left Arm, Patient Position: Sitting, Cuff Size: Normal)   Pulse 63   Temp 98.5 F (36.9 C) (Oral)   Resp 16   Wt 127 lb (57.6 kg)   SpO2 99% Comment: room air  BMI 20.50 kg/m  There were no vitals filed for this visit.   Physical Exam  General appearance: alert, well developed, well nourished, cooperative and in no distress Head: Normocephalic, without obvious abnormality, atraumatic Respiratory: Respirations even and unlabored, normal respiratory rate Breast Exam: Moderate tenderness and vague nodule palpated at about 6 o'clock left breast. No nodule of right breast. No discharge or skin lesions.     Assessment & Plan:     1. Breast pain Likely mastitis. Is overdue for mammogram.   - naproxen (NAPROSYN) 500 MG tablet; Take 1 tablet (500 mg total) by mouth 2 (two) times daily as needed for moderate pain.  Dispense: 30 tablet; Refill: 0 - doxycycline (VIBRA-TABS) 100 MG tablet; Take 1 tablet (100 mg total) by mouth 2 (two) times daily.  Dispense: 20 tablet; Refill:  0 - MM DIAG BREAST TOMO BILATERAL; Future - US BREAST LTD UNI LEFT INC AXILLA; Future - US BREAST LTD UNI RIGHT INC AXILLA; Future       Mila Merry, MD  [facility]

## 2017-07-06 ENCOUNTER — Other Ambulatory Visit: Payer: Self-pay | Admitting: *Deleted

## 2017-07-06 ENCOUNTER — Inpatient Hospital Stay
Admission: RE | Admit: 2017-07-06 | Discharge: 2017-07-06 | Disposition: A | Discharge status: Home / Self Care | Payer: Self-pay | Source: Ambulatory Visit | Attending: *Deleted | Admitting: *Deleted

## 2017-07-06 DIAGNOSIS — Z9289 Personal history of other medical treatment: Secondary | ICD-10-CM

## 2017-07-14 ENCOUNTER — Ambulatory Visit
Admission: RE | Admit: 2017-07-14 | Discharge: 2017-07-14 | Disposition: A | Discharge status: Home / Self Care | Payer: 59 | Source: Ambulatory Visit | Attending: Family Medicine | Admitting: Family Medicine

## 2017-07-14 DIAGNOSIS — N644 Mastodynia: Secondary | ICD-10-CM

## 2017-07-14 DIAGNOSIS — R922 Inconclusive mammogram: Secondary | ICD-10-CM | POA: Diagnosis not present

## 2017-08-22 ENCOUNTER — Telehealth: Payer: Self-pay | Admitting: *Deleted

## 2017-08-22 NOTE — Telephone Encounter (Signed)
Left message for patient to notify them that it is time to schedule annual low dose lung cancer screening CT scan. Instructed patient to call back to verify information prior to the scan being scheduled.  

## 2017-09-01 ENCOUNTER — Telehealth: Payer: Self-pay | Admitting: *Deleted

## 2017-09-01 NOTE — Telephone Encounter (Signed)
Left message for patient to notify them that it is time to schedule annual low dose lung cancer screening CT scan. Instructed patient to call back to verify information prior to the scan being scheduled.  

## 2017-09-22 ENCOUNTER — Encounter: Payer: Self-pay | Admitting: *Deleted

## 2017-11-04 ENCOUNTER — Ambulatory Visit: Payer: 59 | Admitting: Family Medicine

## 2017-11-04 ENCOUNTER — Encounter: Payer: Self-pay | Admitting: Family Medicine

## 2017-11-04 VITALS — BP 138/80 | HR 76 | Temp 98.6°F | Resp 16 | Wt 123.0 lb

## 2017-11-04 DIAGNOSIS — J069 Acute upper respiratory infection, unspecified: Secondary | ICD-10-CM

## 2017-11-04 DIAGNOSIS — R05 Cough: Secondary | ICD-10-CM | POA: Diagnosis not present

## 2017-11-04 DIAGNOSIS — R059 Cough, unspecified: Secondary | ICD-10-CM

## 2017-11-04 MED ORDER — AZITHROMYCIN 250 MG PO TABS: ORAL_TABLET | ORAL | 0 refills | Status: AC

## 2017-11-04 NOTE — Patient Instructions (Addendum)
   You can take OTC Zyrtec (cetirizine) or Allegra (fexofenadine) chest or sinus congestion and to help your cough

## 2017-11-04 NOTE — Progress Notes (Signed)
Patient: Kayla Allen Female    DOB: 01/19/1956   62 y.o.   MRN: [id number] Visit Date: 11/04/2017  Today's Provider: Mila Merryonald Ariann Khaimov, MD   Chief Complaint  Patient presents with  . URI    x 1 week   Subjective:    URI   This is a new problem. Episode onset: 1 week ago. The problem has been unchanged. There has been no fever. Associated symptoms include congestion, coughing (occasionally productive with clear-yellow sputum), headaches, rhinorrhea and a sore throat. Pertinent negatives include no ear pain, plugged ear sensation, sinus pain, sneezing or wheezing. Associated symptoms comments: Tightness in chest. Treatments tried: Benzonatate and OTC Mucus relief. The treatment provided mild relief.   Was prescribed benzonatate from walk in clinic last week.     Allergies  Allergen Reactions  . Penicillins Hives     Current Outpatient Medications:  .  BLACK COHOSH EXTRACT PO, Take 1 tablet by mouth daily., Disp: , Rfl:  .  Multiple Vitamins-Minerals (MULTIVITAMIN WITH MINERALS) tablet, Take 1 tablet by mouth daily., Disp: , Rfl:  .  naproxen (NAPROSYN) 500 MG tablet, Take 1 tablet (500 mg total) by mouth 2 (two) times daily as needed for moderate pain., Disp: 30 tablet, Rfl: 0  Review of Systems  Constitutional: Positive for fatigue. Negative for chills, diaphoresis and fever.  HENT: Positive for congestion, rhinorrhea and sore throat. Negative for ear pain, sinus pain and sneezing.   Eyes: Negative for pain, discharge and itching.  Respiratory: Positive for cough (occasionally productive with clear-yellow sputum) and chest tightness. Negative for shortness of breath and wheezing.   Neurological: Positive for headaches.    Social History   Tobacco Use  . Smoking status: Current Every Day Smoker    Packs/day: 0.50    Years: 35.00    Pack years: 17.50    Types: Cigarettes  . Smokeless tobacco: Never Used  . Tobacco comment: started smoking at age 89; was smoking  1ppd for several years, but has recently cut back to 1/2 ppd  Substance Use Topics  . Alcohol use: Yes    Comment: drinks once a month   Objective:   BP 138/80 (BP Location: Left Arm, Patient Position: Sitting, Cuff Size: Normal)   Pulse 76   Temp 98.6 F (37 C) (Oral)   Resp 16   Wt 123 lb (55.8 kg)   SpO2 96% Comment: room air  BMI 19.85 kg/m     Physical Exam  General Appearance:    Alert, cooperative, no distress  HENT:   bilateral TM normal without fluid or infection, neck has right anterior cervical nodes enlarged, throat normal without erythema or exudate, sinuses nontender, post nasal drip noted and nasal mucosa pale and congested  Eyes:    PERRL, conjunctiva/corneas clear, EOM's intact       Lungs:     Clear to auscultation bilaterally, respirations unlabored  Heart:    Regular rate and rhythm  Neurologic:   Awake, alert, oriented x 3. No apparent focal neurological           defect.           Assessment & Plan:     1. Upper respiratory tract infection, unspecified type  - azithromycin (ZITHROMAX) 250 MG tablet; 2 by mouth today, then 1 daily for 4 days  Dispense: 6 tablet; Refill: 0  2. Cough Likely secondary to post nasal drainage. Can try OTC cetirizine or fexofenadine.  Kayla Huh, MD  [facility]

## 2017-11-11 ENCOUNTER — Ambulatory Visit: Payer: 59 | Admitting: Family Medicine

## 2017-12-19 ENCOUNTER — Encounter: Payer: Self-pay | Admitting: Family Medicine

## 2017-12-19 ENCOUNTER — Ambulatory Visit: Payer: 59 | Admitting: Family Medicine

## 2017-12-19 VITALS — BP 102/66 | HR 68 | Temp 98.2°F | Resp 16 | Wt 124.0 lb

## 2017-12-19 DIAGNOSIS — J069 Acute upper respiratory infection, unspecified: Secondary | ICD-10-CM

## 2017-12-19 NOTE — Progress Notes (Signed)
Patient: Kayla Allen Female    DOB: 1956/04/30   61 y.o.   MRN: [id number] Visit Date: 12/19/2017  Today's Provider: Dortha Kern, PA   Chief Complaint  Patient presents with  . URI   Subjective:    URI   This is a recurrent problem. The current episode started 1 to 4 weeks ago. The problem has been gradually worsening. There has been no fever. Associated symptoms include congestion, coughing, headaches, rhinorrhea and sneezing. Pertinent negatives include no diarrhea, ear pain, plugged ear sensation, sinus pain, sore throat, swollen glands or wheezing.      Patient Active Problem List   Diagnosis Date Noted  . Emphysema of lung (HCC) 09/02/2016  . Personal history of tobacco use, presenting hazards to health 08/30/2016  . Smoking greater than 30 pack years 04/09/2015  . Breast cyst 07/12/2013  . Mastalgia 01/15/2013  . Abnormal mammogram 01/15/2013  . Chest pain 11/18/2006  . Primary thrombocytopenia (HCC) 11/09/2005   Past Surgical History:  Procedure Laterality Date  . CESAREAN SECTION    . SALPINGECTOMY Right    Family History  Problem Relation Age of Onset  . Diabetes Mother   . Hypertension Mother   . Lung cancer Father   . Diabetes Sister   . Hypothyroidism Sister   . Asthma Son   . Diabetes Son   . Diabetes Maternal Grandmother   . Congestive Heart Failure Maternal Grandfather   . Prostate cancer Paternal Grandfather   . Breast cancer Maternal Aunt    Allergies  Allergen Reactions  . Penicillins Hives    Current Outpatient Medications:  .  BLACK COHOSH EXTRACT PO, Take 1 tablet by mouth daily., Disp: , Rfl:  .  Multiple Vitamins-Minerals (MULTIVITAMIN WITH MINERALS) tablet, Take 1 tablet by mouth daily., Disp: , Rfl:  .  naproxen (NAPROSYN) 500 MG tablet, Take 1 tablet (500 mg total) by mouth 2 (two) times daily as needed for moderate pain., Disp: 30 tablet, Rfl: 0  Review of Systems  Constitutional: Positive for diaphoresis and  fatigue. Negative for activity change, appetite change, chills, fever and unexpected weight change.  HENT: Positive for congestion, rhinorrhea and sneezing. Negative for ear discharge, ear pain, hearing loss, postnasal drip, sinus pressure, sinus pain, sore throat, tinnitus, trouble swallowing and voice change.   Eyes: Positive for discharge. Negative for photophobia, pain, redness, itching and visual disturbance.  Respiratory: Positive for cough, chest tightness and shortness of breath. Negative for apnea, choking, wheezing and stridor.   Gastrointestinal: Negative.  Negative for diarrhea.  Neurological: Positive for headaches. Negative for dizziness and light-headedness.   Social History   Tobacco Use  . Smoking status: Current Every Day Smoker    Packs/day: 0.50    Years: 35.00    Pack years: 17.50    Types: Cigarettes  . Smokeless tobacco: Never Used  . Tobacco comment: started smoking at age 8; was smoking 1ppd for several years, but has recently cut back to 1/2 ppd  Substance Use Topics  . Alcohol use: Yes    Comment: drinks once a month   Objective:   BP 102/66 (BP Location: Right Arm, Patient Position: Sitting, Cuff Size: Normal)   Pulse 68   Temp 98.2 F (36.8 C) (Oral)   Resp 16   Wt 124 lb (56.2 kg)   SpO2 97%   BMI 20.01 kg/m  Vitals:   12/19/17 1558  BP: 102/66  Pulse: 68  Resp: 16  Temp: 98.2  F (36.8 C)  TempSrc: Oral  SpO2: 97%  Weight: 124 lb (56.2 kg)   Wt Readings from Last 3 Encounters:  12/19/17 124 lb (56.2 kg)  11/04/17 123 lb (55.8 kg)  07/05/17 127 lb (57.6 kg)   Physical Exam  Constitutional: She is oriented to person, place, and time. She appears well-developed and well-nourished. No distress.  HENT:  Head: Normocephalic and atraumatic.  Right Ear: Hearing and external ear normal.  Left Ear: Hearing and external ear normal.  Nose: Nose normal.  Mouth/Throat: Oropharynx is clear and moist.  No sinus tenderness to palpation.  Eyes:  Conjunctivae and lids are normal. Right eye exhibits no discharge. Left eye exhibits no discharge. No scleral icterus.  Neck: Neck supple.  Cardiovascular: Normal rate and regular rhythm.  Pulmonary/Chest: Effort normal and breath sounds normal. No stridor. She has no wheezes. She has no rales.  Musculoskeletal: Normal range of motion.  Lymphadenopathy:    She has no cervical adenopathy.  Neurological: She is alert and oriented to person, place, and time.  Skin: Skin is intact. No lesion and no rash noted.  Psychiatric: She has a normal mood and affect. Her speech is normal and behavior is normal. Thought content normal.      Assessment & Plan:     1. Upper respiratory tract infection, unspecified type Onset 7-10 days ago without fever or sore throat. Cough with some chest tightness occasionally. Continues to smoke but decreased the amount the past week. No rales, wheezes or rhonchi to ascultation today. May use Mucinex-DM and increase fluid intake. If any fever develops or purulent sputum, may need to add an antibiotic. Call report of progress in 5-7 days - sooner if needed.       Dortha Kernennis Chelisa Hennen, PA  [facility]

## 2018-03-14 ENCOUNTER — Ambulatory Visit: Payer: 59 | Admitting: Podiatry

## 2018-03-14 ENCOUNTER — Encounter: Payer: Self-pay | Admitting: Podiatry

## 2018-03-14 DIAGNOSIS — L989 Disorder of the skin and subcutaneous tissue, unspecified: Secondary | ICD-10-CM

## 2018-03-14 DIAGNOSIS — M2042 Other hammer toe(s) (acquired), left foot: Secondary | ICD-10-CM | POA: Diagnosis not present

## 2018-03-14 DIAGNOSIS — M7752 Other enthesopathy of left foot: Secondary | ICD-10-CM | POA: Diagnosis not present

## 2018-03-14 DIAGNOSIS — M2041 Other hammer toe(s) (acquired), right foot: Secondary | ICD-10-CM

## 2018-03-14 NOTE — Patient Instructions (Signed)
Pre-Operative Instructions  Congratulations, you have decided to take an important step towards improving your quality of life.  You can be assured that the doctors and staff at [facility] will be with you every step of the way.  Here are some important things you should know:  1. Plan to be at the surgery center/hospital at least 1 (one) hour prior to your scheduled time, unless otherwise directed by the surgical center/hospital staff.  You must have a responsible adult accompany you, remain during the surgery and drive you home.  Make sure you have directions to the surgical center/hospital to ensure you arrive on time. 2. If you are having surgery at [facility] or [facility], you will need a copy of your medical history and physical form from your family physician within one month prior to the date of surgery. We will give you a form for your primary physician to complete.  3. We make every effort to accommodate the date you request for surgery.  However, there are times where surgery dates or times have to be moved.  We will contact you as soon as possible if a change in schedule is required.   4. No aspirin/ibuprofen for one week before surgery.  If you are on aspirin, any non-steroidal anti-inflammatory medications (Mobic, Aleve, Ibuprofen) should not be taken seven (7) days prior to your surgery.  You make take Tylenol for pain prior to surgery.  5. Medications - If you are taking daily heart and blood pressure medications, seizure, reflux, allergy, asthma, anxiety, pain or diabetes medications, make sure you notify the surgery center/hospital before the day of surgery so they can tell you which medications you should take or avoid the day of surgery. 6. No food or drink after midnight the night before surgery unless directed otherwise by surgical center/hospital staff. 7. No alcoholic beverages 24-hours prior to surgery.  No smoking 24-hours prior or 24-hours after  surgery. 8. Wear loose pants or shorts. They should be loose enough to fit over bandages, boots, and casts. 9. Don't wear slip-on shoes. Sneakers are preferred. 10. Bring your boot with you to the surgery center/hospital.  Also bring crutches or a walker if your physician has prescribed it for you.  If you do not have this equipment, it will be provided for you after surgery. 11. If you have not been contacted by the surgery center/hospital by the day before your surgery, call to confirm the date and time of your surgery. 12. Leave-time from work may vary depending on the type of surgery you have.  Appropriate arrangements should be made prior to surgery with your employer. 13. Prescriptions will be provided immediately following surgery by your doctor.  Fill these as soon as possible after surgery and take the medication as directed. Pain medications will not be refilled on weekends and must be approved by the doctor. 14. Remove nail polish on the operative foot and avoid getting pedicures prior to surgery. 15. Wash the night before surgery.  The night before surgery wash the foot and leg well with water and the antibacterial soap provided. Be sure to pay special attention to beneath the toenails and in between the toes.  Wash for at least three (3) minutes. Rinse thoroughly with water and dry well with a towel.  Perform this wash unless told not to do so by your physician.  Enclosed: 1 Ice pack (please put in freezer the night before surgery)   1 Hibiclens skin cleaner     Pre-op instructions  If you have any questions regarding the instructions, please do not hesitate to call our office.  [place]: [address], [place], [place] [address] -- [phone]  [place]: [address] [place], [place] [address] -- [phone]  [place]: [address]  [place], [place] [address] -- [phone]  [place]: [address], [address], [place], [place] [address] -- [phone]  Website:  [email or web address] 

## 2018-03-15 NOTE — Progress Notes (Signed)
   Subjective: 62 year old female presenting today with a chief complaint of a painful callus lesion noted to the left fifth toe that has been present for the past several months. She states she has had the area trimmed in the past but it has come back recently and is causing pain. Walking and wearing shoes increases the pain. She has not done anything at home for treatment. Patient is here for further evaluation and treatment.   History reviewed. No pertinent past medical history.   Objective:  Physical Exam General: Alert and oriented x3 in no acute distress  Dermatology: Hyperkeratotic lesion present on the left fifth toe. Pain on palpation with a central nucleated core noted.  Skin is warm, dry and supple bilateral lower extremities. Negative for open lesions or macerations.  Vascular: Palpable pedal pulses bilaterally. No edema or erythema noted. Capillary refill within normal limits.  Neurological: Epicritic and protective threshold grossly intact bilaterally.   Musculoskeletal Exam: Pain on palpation at the keratotic lesion noted as well as to the left fifth toe itself. Range of motion within normal limits bilateral. Muscle strength 5/5 in all groups bilateral. Hammertoe contracture deformity noted to the fifth digits of the bilateral feet.   Assessment: #1 hammertoe deformity bilateral fifth toes #2 painful pre-ulcerative callus lesion noted to the left fifth toe #3 Capsulitis left fifth toe    Plan of Care:  #1 Patient evaluated #2 Excisional debridement of  keratoic lesion using a chisel blade was performed without incident.  #3 Injection of 0.5 mLs Celestone Soluspan injected into the left fifth toe.  #4 Today we discussed the conservative versus surgical management of the presenting pathology. The patient opts for surgical management. All possible complications and details of the procedure were explained. All patient questions were answered. No guarantees were expressed or  implied. #5 Authorization for surgery was initiated today. Surgery will consist of PIPJ arthroplasty with derotational skin plasty bilateral fifth digits.  #6 Post op shoes dispensed bilaterally.  #7 Return to clinic one week post op.     Felecia Shelling, DPM [facility]  Dr. Felecia Shelling, DPM    [address]                                        [place], [place] [address]                Office [phone]  Fax [phone]

## 2018-03-17 ENCOUNTER — Telehealth: Payer: Self-pay | Admitting: *Deleted

## 2018-03-17 NOTE — Telephone Encounter (Signed)
"  I saw your foot doctor here in [place].  I can't think of his name.  He had told me to call this number to schedule my surgery.  He's going to fix my two baby toes and the toe that is next to it.  It's a Hammer Toe Surgery.  I think he said he only does it at your office in [place].  You can reach me at [phone]."

## 2018-03-20 NOTE — Telephone Encounter (Addendum)
"  I was trying to get in touch with you guys before you left for the day.  I see Dr. Logan Bores here in [place].  He told me to go ahead and schedule the surgery."

## 2018-03-23 ENCOUNTER — Telehealth: Payer: Self-pay | Admitting: *Deleted

## 2018-03-23 NOTE — Telephone Encounter (Signed)
"  Good morning Ms. Kayla Allen, this is Katherina Right.  I talked to you the other day.  You said the surgery is scheduled for October 30.  But I need the number, do I need to call another number to confirm that surgery date or whatever?  You can leave me a voicemail or call me back.  Thank you."  I left her a message that she is scheduled for October 31 for the surgery.  I also informed her that she only needs to register with the surgical center online, instructions are in the brochure that was given to her.  I also told her that someone from the surgical center will call and give her the arrival time a day or two prior to her surgery date.

## 2018-03-23 NOTE — Telephone Encounter (Signed)
I spoke with the patient and we are scheduling her surgery for October 31.    I sent a request for medical clearance to Dr. Mila Merry.

## 2018-03-28 ENCOUNTER — Encounter: Payer: 59 | Admitting: Podiatry

## 2018-04-06 ENCOUNTER — Encounter: Payer: Self-pay | Admitting: Podiatry

## 2018-04-06 DIAGNOSIS — M2042 Other hammer toe(s) (acquired), left foot: Secondary | ICD-10-CM | POA: Diagnosis not present

## 2018-04-06 DIAGNOSIS — M2041 Other hammer toe(s) (acquired), right foot: Secondary | ICD-10-CM | POA: Diagnosis not present

## 2018-04-11 ENCOUNTER — Telehealth: Payer: Self-pay | Admitting: Podiatry

## 2018-04-11 NOTE — Telephone Encounter (Signed)
Left foot has some swelling and need to know was this normal/ please call patient back

## 2018-04-11 NOTE — Telephone Encounter (Signed)
Returned patient call and left message to call back.

## 2018-04-14 ENCOUNTER — Other Ambulatory Visit: Payer: Self-pay | Admitting: Podiatry

## 2018-04-14 ENCOUNTER — Encounter: Payer: Self-pay | Admitting: Podiatry

## 2018-04-14 ENCOUNTER — Ambulatory Visit (INDEPENDENT_AMBULATORY_CARE_PROVIDER_SITE_OTHER): Payer: 59 | Admitting: Podiatry

## 2018-04-14 ENCOUNTER — Ambulatory Visit (INDEPENDENT_AMBULATORY_CARE_PROVIDER_SITE_OTHER): Payer: 59

## 2018-04-14 DIAGNOSIS — M2011 Hallux valgus (acquired), right foot: Secondary | ICD-10-CM

## 2018-04-14 DIAGNOSIS — M2042 Other hammer toe(s) (acquired), left foot: Secondary | ICD-10-CM

## 2018-04-14 DIAGNOSIS — M2041 Other hammer toe(s) (acquired), right foot: Secondary | ICD-10-CM

## 2018-04-14 DIAGNOSIS — Z9889 Other specified postprocedural states: Secondary | ICD-10-CM

## 2018-04-16 NOTE — Progress Notes (Signed)
   Subjective:  Patient presents today status post hammertoe repair bilateral 5th digits, 4th digit left. DOS: 04/06/18. She reports some pain in the fourth left toe. She denies any significant pain or modifying factors of the right foot. Patient is here for further evaluation and treatment.    No past medical history on file.    Objective/Physical Exam Neurovascular status intact.  Skin incisions appear to be well coapted with sutures and staples intact. No sign of infectious process noted. No dehiscence. No active bleeding noted. Moderate edema noted to the surgical extremity.  Radiographic Exam:  Orthopedic hardware and osteotomies sites appear to be stable with routine healing.  Assessment: 1. s/p hammertoe repair bilateral 5th digit, 4th digit left. DOS: 04/06/18   Plan of Care:  1. Patient was evaluated. X-rays reviewed 2. Dressing changed.  3. Continue weightbearing in post op shoe for one week.  4. Return to clinic in one week for suture removal.    Felecia Shelling, DPM [facility]  Dr. Felecia Shelling, DPM    [address]                                        [place], [place] [address]                Office [phone]  Fax [phone]

## 2018-04-25 ENCOUNTER — Encounter: Payer: Self-pay | Admitting: Podiatry

## 2018-04-25 ENCOUNTER — Ambulatory Visit (INDEPENDENT_AMBULATORY_CARE_PROVIDER_SITE_OTHER): Payer: 59 | Admitting: Podiatry

## 2018-04-25 DIAGNOSIS — Z9889 Other specified postprocedural states: Secondary | ICD-10-CM

## 2018-04-25 DIAGNOSIS — M2041 Other hammer toe(s) (acquired), right foot: Secondary | ICD-10-CM

## 2018-04-25 DIAGNOSIS — M2042 Other hammer toe(s) (acquired), left foot: Secondary | ICD-10-CM

## 2018-04-27 NOTE — Progress Notes (Signed)
   Subjective:  Patient presents today status post hammertoe repair bilateral 5th digits, 4th digit left. DOS: 04/06/18. She states she is doing well and her pain has improved. She does reports some tenderness in the toes of the left foot. She denies modifying factors. She has been using the post op shoes as directed. Patient is here for further evaluation and treatment.    No past medical history on file.    Objective/Physical Exam Neurovascular status intact.  Skin incisions appear to be well coapted with sutures and staples intact. No sign of infectious process noted. No dehiscence. No active bleeding noted. Moderate edema noted to the surgical extremity.  Assessment: 1. s/p hammertoe repair bilateral 5th digit, 4th digit left. DOS: 04/06/18   Plan of Care:  1. Patient was evaluated.  2. Sutures removed.  3. Transition our of post op shoes.  4. Return to clinic in 4 weeks.     Felecia ShellingBrent M. Tayshon Winker, DPM [facility]  Dr. Felecia ShellingBrent M. Cielo Arias, DPM    [address]                                        [place], [place] [address]                Office [phone]  Fax [phone]

## 2018-05-08 ENCOUNTER — Telehealth: Payer: Self-pay | Admitting: Podiatry

## 2018-05-08 NOTE — Telephone Encounter (Signed)
Tried calling pt to reschedule appointment to 31 December but cell phone voicemail is full so I was unable to leave a message.

## 2018-05-21 DIAGNOSIS — Z23 Encounter for immunization: Secondary | ICD-10-CM | POA: Diagnosis not present

## 2018-05-22 NOTE — Progress Notes (Signed)
DOS  04/06/2018  Hammertoe repair bilateral fifth digits.

## 2018-05-25 ENCOUNTER — Telehealth: Payer: Self-pay | Admitting: Podiatry

## 2018-05-25 NOTE — Telephone Encounter (Signed)
Pt had surgery previously and has been wearing boot but would like to know if she can began wearing a regular shoe again.

## 2018-05-25 NOTE — Telephone Encounter (Signed)
Returned patient call, informed her via voice mail that she can start wearing tennis shoes now and to keep follow up appt on 06/02/18 with Dr. Logan BoresEvans

## 2018-06-02 ENCOUNTER — Ambulatory Visit (INDEPENDENT_AMBULATORY_CARE_PROVIDER_SITE_OTHER): Payer: 59

## 2018-06-02 ENCOUNTER — Encounter: Payer: Self-pay | Admitting: Podiatry

## 2018-06-02 ENCOUNTER — Ambulatory Visit (INDEPENDENT_AMBULATORY_CARE_PROVIDER_SITE_OTHER): Payer: 59 | Admitting: Podiatry

## 2018-06-02 DIAGNOSIS — M2041 Other hammer toe(s) (acquired), right foot: Secondary | ICD-10-CM

## 2018-06-02 DIAGNOSIS — M2042 Other hammer toe(s) (acquired), left foot: Secondary | ICD-10-CM | POA: Diagnosis not present

## 2018-06-02 DIAGNOSIS — Z9889 Other specified postprocedural states: Secondary | ICD-10-CM

## 2018-06-04 NOTE — Progress Notes (Signed)
   Subjective:  Patient presents today status post hammertoe repair bilateral 5th digits, 4th digit left. DOS: 04/06/18. She states she is doing well. She reports some mild sensitivity in th eleft 5th toe but sates it has improved since her last visit. There are no modifying factors noted. Patient is here for further evaluation and treatment.    No past medical history on file.    Objective/Physical Exam Neurovascular status intact.  Skin incisions appear to be well coapted. No sign of infectious process noted. No dehiscence. No active bleeding noted. Moderate edema noted to the surgical extremity.  Radiographic Exam:  Orthopedic hardware and osteotomies sites appear to be stable with routine healing.  Assessment: 1. s/p hammertoe repair bilateral 5th digit, 4th digit left. DOS: 04/06/18   Plan of Care:  1. Patient was evaluated. X-Rays reviewed.  2. May resume full activity with no restrictions beginning on 06/07/18.  3. Recommended good shoe gear.  4. Return to clinic as needed.     Felecia ShellingBrent M. Evans, DPM [facility]  Dr. Felecia ShellingBrent M. Evans, DPM    [address]                                        [place], [place] [address]                Office [phone]  Fax [phone]

## 2018-06-06 ENCOUNTER — Encounter: Payer: Self-pay | Admitting: Podiatry

## 2019-05-15 ENCOUNTER — Other Ambulatory Visit: Payer: Self-pay | Admitting: Physical Medicine and Rehabilitation

## 2019-05-15 DIAGNOSIS — M5441 Lumbago with sciatica, right side: Secondary | ICD-10-CM

## 2019-05-25 ENCOUNTER — Ambulatory Visit
Admission: RE | Admit: 2019-05-25 | Discharge: 2019-05-25 | Disposition: A | Discharge status: Home / Self Care | Payer: 59 | Source: Ambulatory Visit | Attending: Physical Medicine and Rehabilitation | Admitting: Physical Medicine and Rehabilitation

## 2019-05-25 ENCOUNTER — Other Ambulatory Visit: Payer: Self-pay

## 2019-05-25 DIAGNOSIS — M5441 Lumbago with sciatica, right side: Secondary | ICD-10-CM | POA: Diagnosis present

## 2019-12-04 ENCOUNTER — Other Ambulatory Visit: Payer: Self-pay

## 2019-12-04 ENCOUNTER — Ambulatory Visit: Payer: 59 | Admitting: Podiatry

## 2019-12-04 DIAGNOSIS — M79672 Pain in left foot: Secondary | ICD-10-CM

## 2019-12-04 DIAGNOSIS — L989 Disorder of the skin and subcutaneous tissue, unspecified: Secondary | ICD-10-CM

## 2019-12-04 NOTE — Progress Notes (Signed)
   Subjective: 64 y.o. female presenting to the office today for evaluation of pain to the left fifth toe.  Patient does have a history of hammertoe repair to the fourth and fifth digits bilateral.  DOS: 04/06/2018.  She states that she is doing very well however she has developed a symptomatic callus to the fifth digit of the left foot that has been slowly developing over the past year.  She has been applying ointment on it that helps minimally.  She would like it evaluated and treated   No past medical history on file.   Objective:  Physical Exam General: Alert and oriented x3 in no acute distress  Dermatology: Hyperkeratotic lesion(s) present on the dorsal lateral aspect of the fifth digit left foot. Pain on palpation noted. Skin is warm, dry and supple bilateral lower extremities. Negative for open lesions or macerations.  Vascular: Palpable pedal pulses bilaterally. No edema or erythema noted. Capillary refill within normal limits.  Neurological: Epicritic and protective threshold grossly intact bilaterally.   Musculoskeletal Exam: Pain on palpation at the keratotic lesion(s) noted. Range of motion within normal limits bilateral. Muscle strength 5/5 in all groups bilateral.  Assessment: 1.  Preulcerative callus/corn fifth digit left foot   Plan of Care:  1. Patient evaluated 2. Excisional debridement of keratoic lesion(s) using a chisel blade was performed without incident.  3. Salinocaine applied with a Band-Aid 4.  Recommend OTC corn and callus remover daily under occlusion with a Band-Aid 5.  Return to clinic as needed  Biomedical engineer in [facility] [place] [facility]  Dr. Felecia Shelling, DPM    [address]                                        [place], [place] [address]                Office [phone]  Fax [phone]

## 2020-07-01 ENCOUNTER — Telehealth: Payer: Self-pay

## 2020-07-01 DIAGNOSIS — Z1231 Encounter for screening mammogram for malignant neoplasm of breast: Secondary | ICD-10-CM

## 2020-07-01 NOTE — Telephone Encounter (Signed)
That's fine

## 2020-07-01 NOTE — Telephone Encounter (Signed)
Copied from CRM [id number]. Topic: Referral - Request for Referral >> Jul 01, 2020 10:41 AM Randol Kern wrote: Pt is requesting to have a mammogram, please advise   Best contact: [phone]

## 2020-07-01 NOTE — Telephone Encounter (Signed)
Ok to order 

## 2020-07-02 NOTE — Telephone Encounter (Signed)
Order placed. Tried calling patient; no answer and voice mail box fill. Will try calling back later. OK for [facility] to advise patient that mammogram order is placed if she calls back.

## 2020-07-02 NOTE — Addendum Note (Signed)
Addended by: Benjiman Core on: 07/02/2020 03:48 PM   Modules accepted: Orders

## 2020-08-20 ENCOUNTER — Other Ambulatory Visit (HOSPITAL_COMMUNITY)
Admission: RE | Admit: 2020-08-20 | Discharge: 2020-08-20 | Disposition: A | Discharge status: Home / Self Care | Payer: 59 | Source: Ambulatory Visit | Attending: Family Medicine | Admitting: Family Medicine

## 2020-08-20 ENCOUNTER — Encounter: Payer: Self-pay | Admitting: Family Medicine

## 2020-08-20 ENCOUNTER — Other Ambulatory Visit: Payer: Self-pay

## 2020-08-20 ENCOUNTER — Ambulatory Visit (INDEPENDENT_AMBULATORY_CARE_PROVIDER_SITE_OTHER): Payer: 59 | Admitting: Family Medicine

## 2020-08-20 VITALS — BP 144/86 | HR 72 | Temp 97.7°F | Ht 66.0 in | Wt 119.2 lb

## 2020-08-20 DIAGNOSIS — Z124 Encounter for screening for malignant neoplasm of cervix: Secondary | ICD-10-CM | POA: Diagnosis not present

## 2020-08-20 DIAGNOSIS — Z0000 Encounter for general adult medical examination without abnormal findings: Secondary | ICD-10-CM

## 2020-08-20 DIAGNOSIS — M25512 Pain in left shoulder: Secondary | ICD-10-CM

## 2020-08-20 DIAGNOSIS — Z1211 Encounter for screening for malignant neoplasm of colon: Secondary | ICD-10-CM

## 2020-08-20 DIAGNOSIS — J439 Emphysema, unspecified: Secondary | ICD-10-CM

## 2020-08-20 DIAGNOSIS — Z136 Encounter for screening for cardiovascular disorders: Secondary | ICD-10-CM

## 2020-08-20 DIAGNOSIS — F17210 Nicotine dependence, cigarettes, uncomplicated: Secondary | ICD-10-CM

## 2020-08-20 DIAGNOSIS — D696 Thrombocytopenia, unspecified: Secondary | ICD-10-CM

## 2020-08-20 DIAGNOSIS — M542 Cervicalgia: Secondary | ICD-10-CM | POA: Diagnosis not present

## 2020-08-20 DIAGNOSIS — Z23 Encounter for immunization: Secondary | ICD-10-CM | POA: Diagnosis not present

## 2020-08-20 MED ORDER — BUPROPION HCL ER (SR) 100 MG PO TB12: ORAL_TABLET | ORAL | 5 refills | Status: DC

## 2020-08-20 NOTE — Progress Notes (Signed)
Complete physical exam   Patient: Kayla Allen   DOB: [birth year]   65 y.o. Female  MRN: [id number] Visit Date: 08/20/2020  Today's healthcare provider: Mila Merry, MD   Chief Complaint  Patient presents with  . Annual Exam   Subjective    Kayla Allen is a 65 y.o. female who presents today for a complete physical exam.  She reports consuming a general diet. She is waking a lot at work. She generally feels well. She reports sleeping well. She does have additional problems to discuss today.  HPI  Pt would like to discuss mammogram (I gave her a card) and would like to discuss neck pain that is intermittent.  Neck pain: Patient complains of left side neck pain that occurs intermittently. Pain occurs 3-4 times each week.  No past medical history on file. Past Surgical History:  Procedure Laterality Date  . CESAREAN SECTION    . SALPINGECTOMY Right    Social History   Socioeconomic History  . Marital status: Widowed    Spouse name: Not on file  . Number of children: 1  . Years of education: Not on file  . Highest education level: Not on file  Occupational History  . Occupation: [employer]    Comment: Biochemist, clinical  Tobacco Use  . Smoking status: Current Every Day Smoker    Packs/day: 0.50    Years: 40.00    Pack years: 20.00    Types: Cigarettes  . Smokeless tobacco: Never Used  . Tobacco comment: started smoking at age 59; was smoking 1ppd for several years, but has recently cut back to 1/2 ppd  Substance and Sexual Activity  . Alcohol use: Yes    Comment: drinks once a month  . Drug use: No  . Sexual activity: Not on file  Other Topics Concern  . Not on file  Social History Narrative  . Not on file   Social Determinants of Health   Financial Resource Strain: Not on file  Food Insecurity: Not on file  Transportation Needs: Not on file  Physical Activity: Not on file  Stress: Not on file  Social Connections: Not on  file  Intimate Partner Violence: Not on file   Family Status  Relation Name Status  . Mother  Alive  . Father  Deceased at age 16       lung cancer  . Sister  Alive  . Brother  Alive  . Son  Alive  . MGM  Alive  . MGF  Deceased  . PGF  Deceased  . Sister  Alive  . Sister  Alive  . Brother  Alive  . Brother  Alive  . Mat Aunt  Deceased   Family History  Problem Relation Age of Onset  . Diabetes Mother   . Hypertension Mother   . Lung cancer Father   . Diabetes Sister   . Hypothyroidism Sister   . Asthma Son   . Diabetes Son   . Diabetes Maternal Grandmother   . Congestive Heart Failure Maternal Grandfather   . Prostate cancer Paternal Grandfather   . Breast cancer Maternal Aunt    Allergies  Allergen Reactions  . Penicillins Hives    Patient Care Team: Malva Limes, MD as PCP - General (Family Medicine) Lemar Livings, Merrily Pew, MD (General Surgery) Malva Limes, MD as Referring Physician (Family Medicine)   Medications: Outpatient Medications Prior to Visit  Medication Sig  . BLACK COHOSH  EXTRACT PO Take 1 tablet by mouth daily.  . Multiple Vitamins-Minerals (MULTIVITAMIN WITH MINERALS) tablet Take 1 tablet by mouth daily.  . naproxen (NAPROSYN) 500 MG tablet Take 1 tablet (500 mg total) by mouth 2 (two) times daily as needed for moderate pain.  Marland Kitchen oxyCODONE-acetaminophen (PERCOCET/ROXICET) 5-325 MG tablet TK 1 T PO Q 4 H PRN FOR PAIN   No facility-administered medications prior to visit.    Review of Systems  Constitutional: Negative for chills, fatigue and fever.  HENT: Negative for congestion, ear pain, rhinorrhea, sneezing and sore throat.   Eyes: Positive for visual disturbance. Negative for pain and redness.  Respiratory: Negative for cough, shortness of breath and wheezing.   Cardiovascular: Negative for chest pain and leg swelling.  Gastrointestinal: Positive for abdominal distention and constipation. Negative for abdominal pain, blood in stool,  diarrhea and nausea.  Endocrine: Negative for polydipsia and polyphagia.  Genitourinary: Negative.  Negative for dysuria, flank pain, hematuria, pelvic pain, vaginal bleeding and vaginal discharge.  Musculoskeletal: Positive for neck pain. Negative for arthralgias, back pain, gait problem and joint swelling.  Skin: Negative for rash.  Neurological: Negative.  Negative for dizziness, tremors, seizures, weakness, light-headedness, numbness and headaches.  Hematological: Negative for adenopathy.  Psychiatric/Behavioral: Negative.  Negative for behavioral problems, confusion and dysphoric mood. The patient is not nervous/anxious and is not hyperactive.        Irritability       Objective    BP (!) 144/86 (BP Location: Right Arm, Patient Position: Sitting, Cuff Size: Normal)   Pulse 72   Temp 97.7 F (36.5 C) (Temporal)   Ht 5\' 6"  (1.676 m)   Wt 119 lb 3.2 oz (54.1 kg)   SpO2 100%   BMI 19.24 kg/m    Physical Exam   General Appearance:    Thin female. Alert, cooperative, in no acute distress, appears stated age   Head:    Normocephalic, without obvious abnormality, atraumatic  Eyes:    PERRL, conjunctiva/corneas clear, EOM's intact, fundi    benign, both eyes  Ears:    Normal TM's and external ear canals, both ears  Neck:   Supple, symmetrical, trachea midline, no adenopathy;    thyroid:  no enlargement/tenderness/nodules; no carotid   bruit or JVD  Back:     Symmetric, no curvature, ROM normal, no CVA tenderness  Lungs:     Clear to auscultation bilaterally, respirations unlabored  Chest Wall:    No tenderness or deformity   Heart:    Normal heart rate. Normal rhythm. No murmurs, rubs, or gallops.   Breast Exam:    deferred  Abdomen:     Soft, non-tender, bowel sounds active all four quadrants,    no masses, no organomegaly  Pelvic:    deferred  Extremities:   All extremities are intact. No cyanosis or edema. Slightly tender over left paracervical muscles.   Pulses:   2+ and  symmetric all extremities  Skin:   Skin color, texture, turgor normal, no rashes or lesions  Lymph nodes:   Cervical, supraclavicular, and axillary nodes normal  Neurologic:   CNII-XII intact, normal strength, sensation and reflexes    throughout    Last depression screening scores PHQ 2/9 Scores 08/20/2020 08/13/2016 04/11/2015  PHQ - 2 Score 1 2 1   PHQ- 9 Score 5 9 10    Last fall risk screening Fall Risk  08/20/2020  Falls in the past year? 0  Number falls in past yr: 0  Injury with Fall?  0   Last Audit-C alcohol use screening Alcohol Use Disorder Test (AUDIT) 08/20/2020  1. How often do you have a drink containing alcohol? 1  2. How many drinks containing alcohol do you have on a typical day when you are drinking? 0  3. How often do you have six or more drinks on one occasion? 0  AUDIT-C Score 1  Alcohol Brief Interventions/Follow-up AUDIT Score <7 follow-up not indicated   A score of 3 or more in women, and 4 or more in men indicates increased risk for alcohol abuse, EXCEPT if all of the points are from question 1   No results found for any visits on 08/20/20.  Assessment & Plan    Routine Health Maintenance and Physical Exam  Exercise Activities and Dietary recommendations Goals   None     Immunization History  Administered Date(s) Administered  . PPD Test 01/28/2016  . Tdap 08/13/2016  . Zoster 04/11/2015    Health Maintenance  Topic Date Due  . COVID-19 Vaccine (1) Never done  . PAP SMEAR-Modifier  04/10/2018  . MAMMOGRAM  07/15/2019  . INFLUENZA VACCINE  10/06/2020 (Originally 01/06/2020)  . HIV Screening  08/14/2027 (Originally 05/10/1971)  . COLONOSCOPY (Pts 45-86yrs Insurance coverage will need to be confirmed)  10/21/2020  . TETANUS/TDAP  08/14/2026  . Hepatitis C Screening  Completed  . HPV VACCINES  Aged Out    Discussed health benefits of physical activity, and encouraged her to engage in regular exercise appropriate for her age and  condition.  Declined flu vaccine.  Recommend routine eye exam.   1. Acute pain of left shoulder intermittent for several months consistent with referred pain from c-spine - DG Cervical Spine Complete; Future  2. Neck pain  - DG Cervical Spine Complete; Future  3. Pulmonary emphysema, unspecified emphysema type (HCC) Per LDCT, encouraged smoking cessation and annual LDCT lungs  4. Smoking greater than 30 pack years  - Ambulatory Referral for Lung Cancer Scre - buPROPion (WELLBUTRIN SR) 100 MG 12 hr tablet; 1 tablet daily for 3 days, then 1 tablet twice daily. Stop smoking 14 days after starting medication  Dispense: 60 tablet; Refill: 5  5. Cervical cancer screening  - Cytology - HPV w/PAP any interpr. 16/18 genotyping if + HPV (reflex) ([facility])  6. Encounter for special screening examination for cardiovascular disorder  - Comprehensive metabolic panel - Lipid panel  7. Thrombocytopenia (HCC)  - CBC  8. Need for shingles vaccine  - Administer Zoster, Recombinant (Shingrix) Vaccine #1  9. Colon cancer screening  - Cologuard      The entirety of the information documented in the History of Present Illness, Review of Systems and Physical Exam were personally obtained by me. Portions of this information were initially documented by the CMA and reviewed by me for thoroughness and accuracy.      Mila Merry, MD  [facility] [phone] (phone) [phone] (fax)  [facility]

## 2020-08-20 NOTE — Patient Instructions (Addendum)
.   Go to the [facility] on [address] for neck Xray   Please call the [facility] at [facility] at [phone] to schedule your mammogram  . Please contact your eyecare professional to schedule a routine eye exam. I recommend seeing Dr. Edger House at [phone] or the [facility] at [phone]   Let me know if you need a higher dose of bupropion to help stop smoking

## 2020-08-21 ENCOUNTER — Other Ambulatory Visit: Payer: Self-pay | Admitting: Family Medicine

## 2020-08-21 ENCOUNTER — Telehealth: Payer: Self-pay

## 2020-08-21 DIAGNOSIS — Z1231 Encounter for screening mammogram for malignant neoplasm of breast: Secondary | ICD-10-CM

## 2020-08-21 LAB — COMPREHENSIVE METABOLIC PANEL
ALT: 22 IU/L (ref 0–32)
AST: 20 IU/L (ref 0–40)
Albumin/Globulin Ratio: 1.9 (ref 1.2–2.2)
Albumin: 4.8 g/dL (ref 3.8–4.8)
Alkaline Phosphatase: 129 IU/L — ABNORMAL HIGH (ref 44–121)
BUN/Creatinine Ratio: 12 (ref 12–28)
BUN: 10 mg/dL (ref 8–27)
Bilirubin Total: 0.6 mg/dL (ref 0.0–1.2)
CO2: 24 mmol/L (ref 20–29)
Calcium: 9.6 mg/dL (ref 8.7–10.3)
Chloride: 106 mmol/L (ref 96–106)
Creatinine, Ser: 0.82 mg/dL (ref 0.57–1.00)
Globulin, Total: 2.5 g/dL (ref 1.5–4.5)
Glucose: 94 mg/dL (ref 65–99)
Potassium: 4.5 mmol/L (ref 3.5–5.2)
Sodium: 144 mmol/L (ref 134–144)
Total Protein: 7.3 g/dL (ref 6.0–8.5)
eGFR: 81 mL/min/{1.73_m2} (ref >59)

## 2020-08-21 LAB — CBC
Hematocrit: 42.1 % (ref 34.0–46.6)
Hemoglobin: 13.7 g/dL (ref 11.1–15.9)
MCH: 27.6 pg (ref 26.6–33.0)
MCHC: 32.5 g/dL (ref 31.5–35.7)
MCV: 85 fL (ref 79–97)
Platelets: 150 10*3/uL (ref 150–450)
RBC: 4.97 x10E6/uL (ref 3.77–5.28)
RDW: 12.3 % (ref 11.7–15.4)
WBC: 4.1 10*3/uL (ref 3.4–10.8)

## 2020-08-21 LAB — LIPID PANEL
Chol/HDL Ratio: 2.8 ratio (ref 0.0–4.4)
Cholesterol, Total: 146 mg/dL (ref 100–199)
HDL: 52 mg/dL (ref >39)
LDL Chol Calc (NIH): 82 mg/dL (ref 0–99)
Triglycerides: 57 mg/dL (ref 0–149)
VLDL Cholesterol Cal: 12 mg/dL (ref 5–40)

## 2020-08-21 NOTE — Telephone Encounter (Signed)
-----   Message from Malva Limes, MD sent at 08/21/2020  7:35 AM EDT ----- Labs are completely normal. Cholesterol is good at 146. Pap test should be complete next week.

## 2020-08-21 NOTE — Telephone Encounter (Signed)
I called pt and pt did not answer. VM left for pt to return call. [facility] may release results. 

## 2020-08-22 LAB — CYTOLOGY - PAP
Comment: NEGATIVE
Diagnosis: NEGATIVE
High risk HPV: NEGATIVE

## 2020-08-25 ENCOUNTER — Telehealth: Payer: Self-pay

## 2020-08-25 NOTE — Telephone Encounter (Signed)
-----   Message from Malva Limes, MD sent at 08/22/2020  3:39 PM EDT ----- Pap and HPV tests are negative. Current recommendations are that no future screenings are needed if these are negative after the age of 91, so she doesn't ever have to have another pap smear.

## 2020-08-26 ENCOUNTER — Ambulatory Visit
Admission: RE | Admit: 2020-08-26 | Discharge: 2020-08-26 | Disposition: A | Discharge status: Home / Self Care | Payer: 59 | Source: Ambulatory Visit | Attending: Family Medicine | Admitting: Family Medicine

## 2020-08-26 DIAGNOSIS — M542 Cervicalgia: Secondary | ICD-10-CM | POA: Insufficient documentation

## 2020-08-26 DIAGNOSIS — M25512 Pain in left shoulder: Secondary | ICD-10-CM

## 2020-09-02 ENCOUNTER — Telehealth: Payer: Self-pay | Admitting: *Deleted

## 2020-09-02 NOTE — Telephone Encounter (Signed)
Attempted to contact patient to schedule lung screening. Left message to call Shawn at [phone]. 

## 2020-09-03 ENCOUNTER — Other Ambulatory Visit: Payer: Self-pay | Admitting: Family Medicine

## 2020-09-03 DIAGNOSIS — M542 Cervicalgia: Secondary | ICD-10-CM

## 2020-09-03 DIAGNOSIS — M5030 Other cervical disc degeneration, unspecified cervical region: Secondary | ICD-10-CM

## 2020-09-03 DIAGNOSIS — M25512 Pain in left shoulder: Secondary | ICD-10-CM

## 2020-09-03 DIAGNOSIS — N644 Mastodynia: Secondary | ICD-10-CM

## 2020-09-03 MED ORDER — NAPROXEN 500 MG PO TABS: 500.0000 mg | ORAL_TABLET | Freq: Two times a day (BID) | ORAL | 1 refills | Status: DC

## 2020-09-04 ENCOUNTER — Telehealth: Payer: Self-pay

## 2020-09-04 NOTE — Telephone Encounter (Signed)
Message left notifying patient that it is time to schedule the low dose lung cancer screening CT scan.  Instructed patient to return call to Shawn Perkins at [phone] to verify information prior to CT scan being scheduled.    

## 2020-09-09 ENCOUNTER — Other Ambulatory Visit: Payer: Self-pay

## 2020-09-09 ENCOUNTER — Ambulatory Visit
Admission: RE | Admit: 2020-09-09 | Discharge: 2020-09-09 | Disposition: A | Discharge status: Home / Self Care | Payer: 59 | Source: Ambulatory Visit | Attending: Family Medicine | Admitting: Family Medicine

## 2020-09-09 DIAGNOSIS — Z1231 Encounter for screening mammogram for malignant neoplasm of breast: Secondary | ICD-10-CM | POA: Diagnosis not present

## 2020-09-22 ENCOUNTER — Telehealth: Payer: Self-pay | Admitting: *Deleted

## 2020-09-22 NOTE — Telephone Encounter (Signed)
Left message on patient's voicemail to call me back re: scheduling her yearly lung screening CT Scan.

## 2020-09-29 ENCOUNTER — Encounter: Payer: Self-pay | Admitting: *Deleted

## 2021-07-03 IMAGING — MG MM DIGITAL SCREENING BILAT W/ TOMO AND CAD
8 series · 9 of 24 positions shown · non-contrast
Comparison: Previous exam(s).

CLINICAL DATA: Screening.

EXAM:
DIGITAL SCREENING BILATERAL MAMMOGRAM WITH TOMOSYNTHESIS AND CAD
TECHNIQUE: Bilateral screening digital craniocaudal and mediolateral oblique
mammograms were obtained. Bilateral screening digital breast
tomosynthesis was performed. The images were evaluated with
computer-aided detection.

[L CC synth-2D]
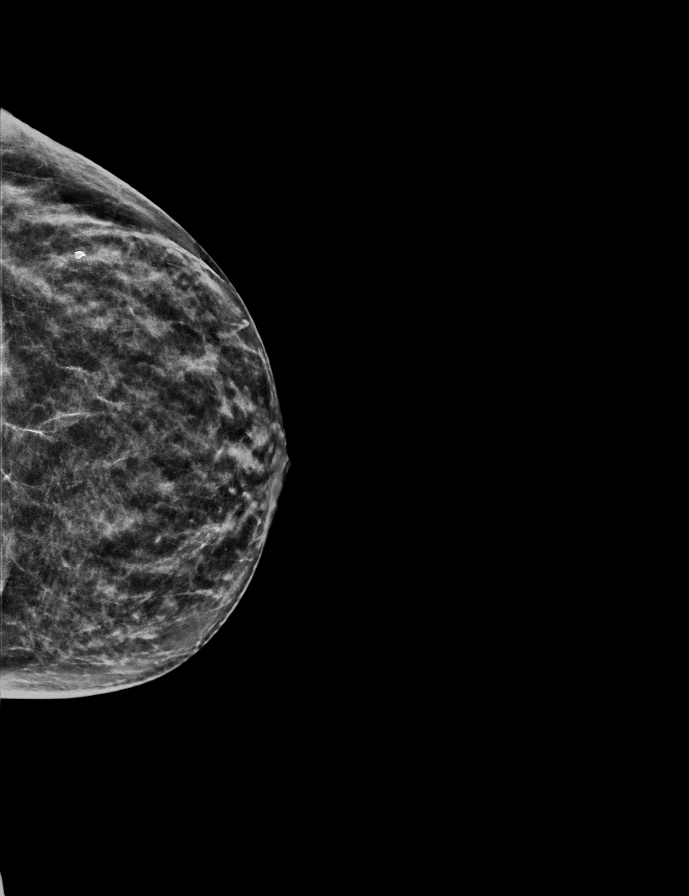

[R MLO synth-2D]
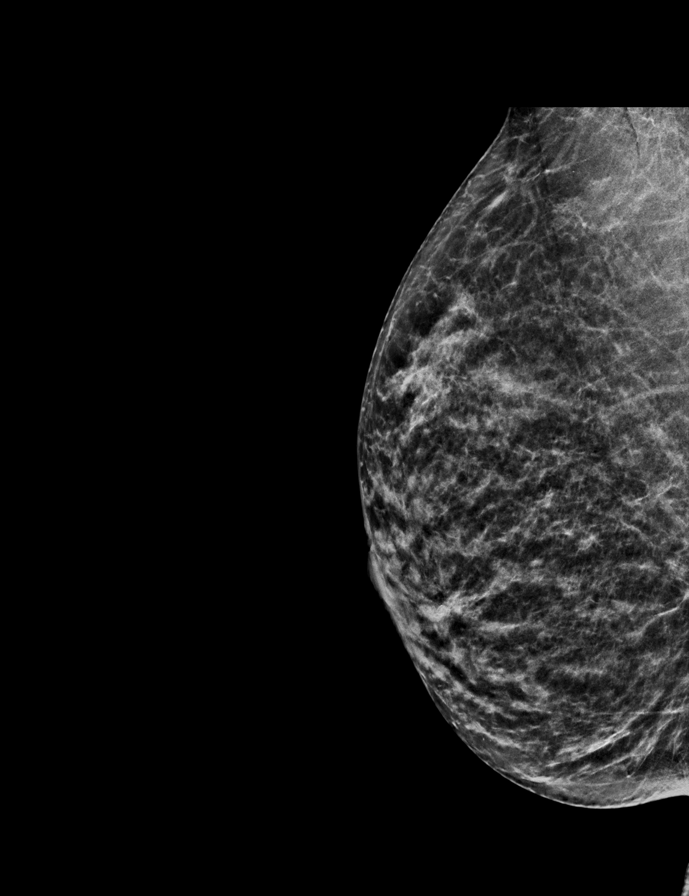

[R CC synth-2D]
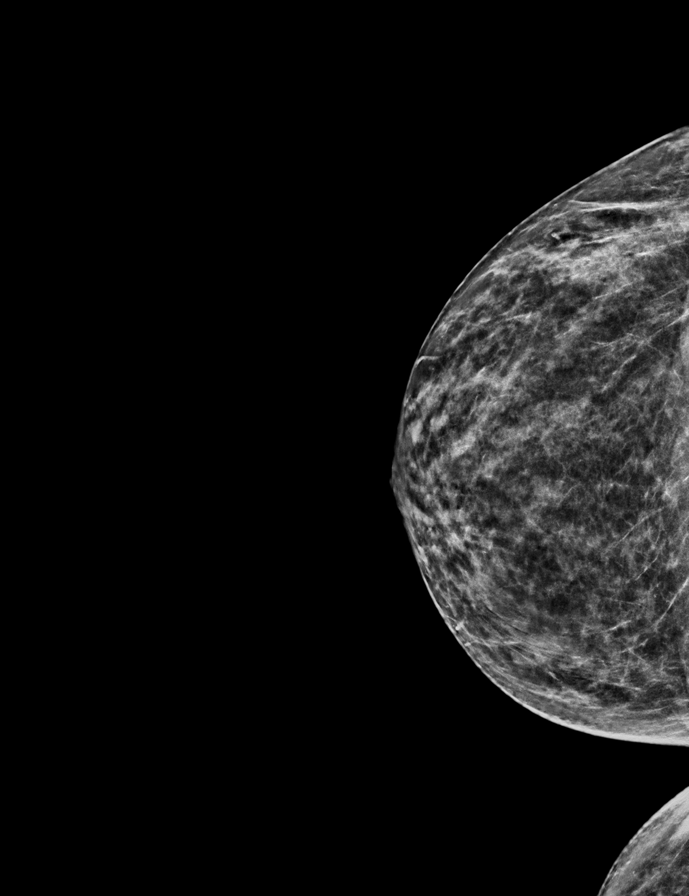

[L MLO synth-2D]
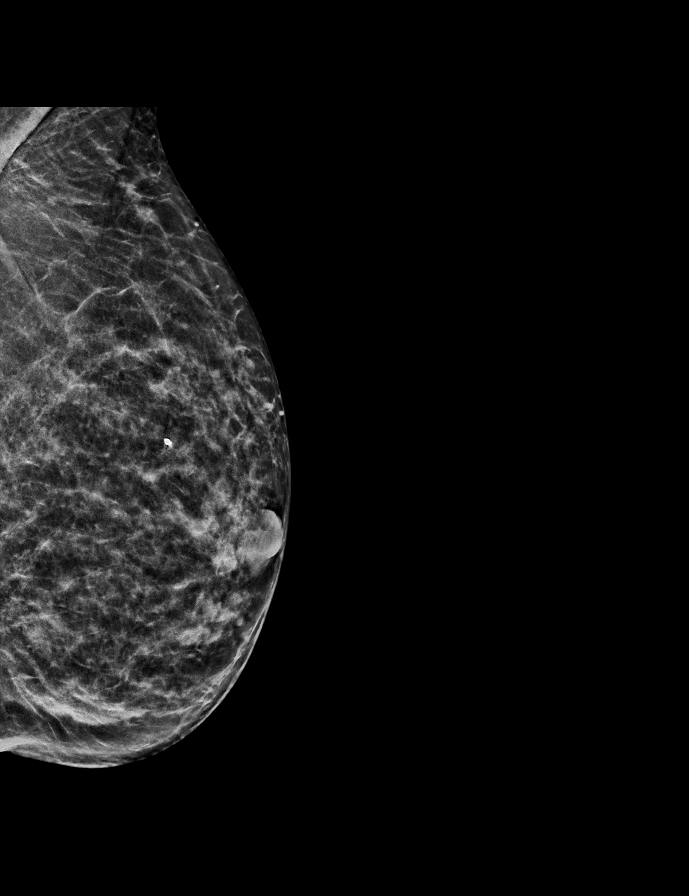

[R MLO tomo · 2 of 50 frames shown]
[frame 17/50]
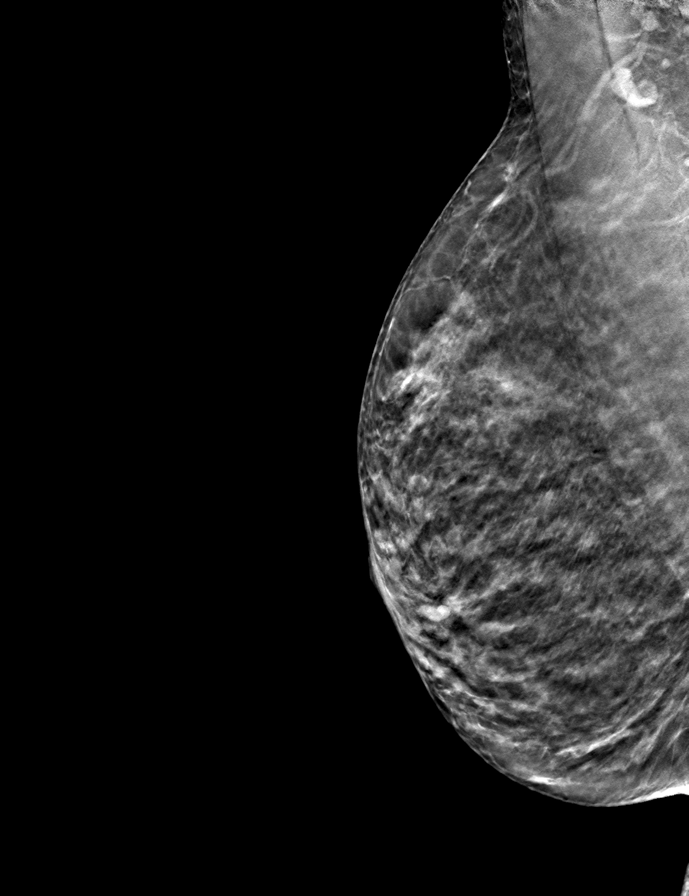
[frame 25/50]
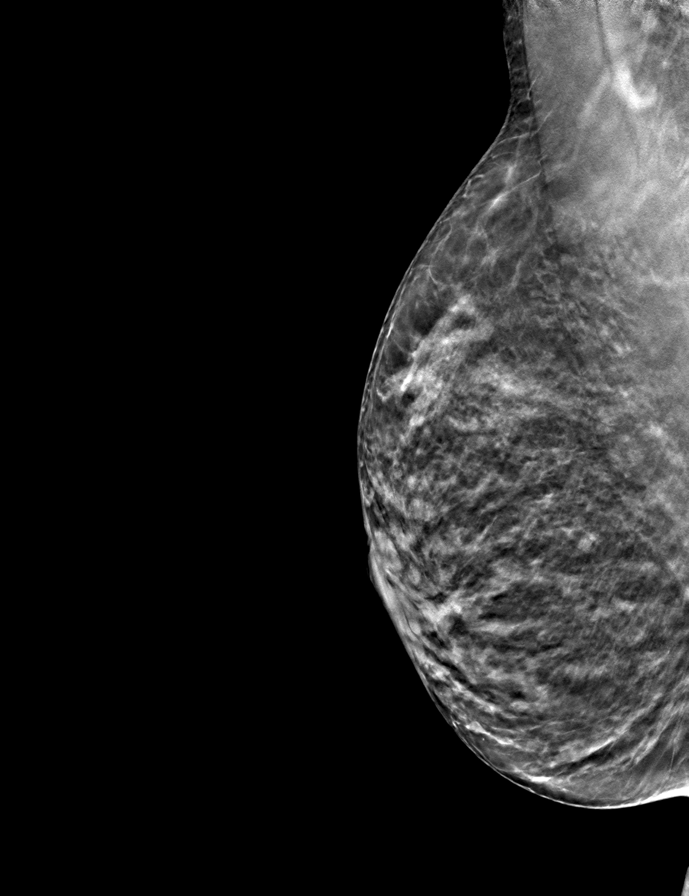

[L MLO tomo · tomo slice 27/54.0]
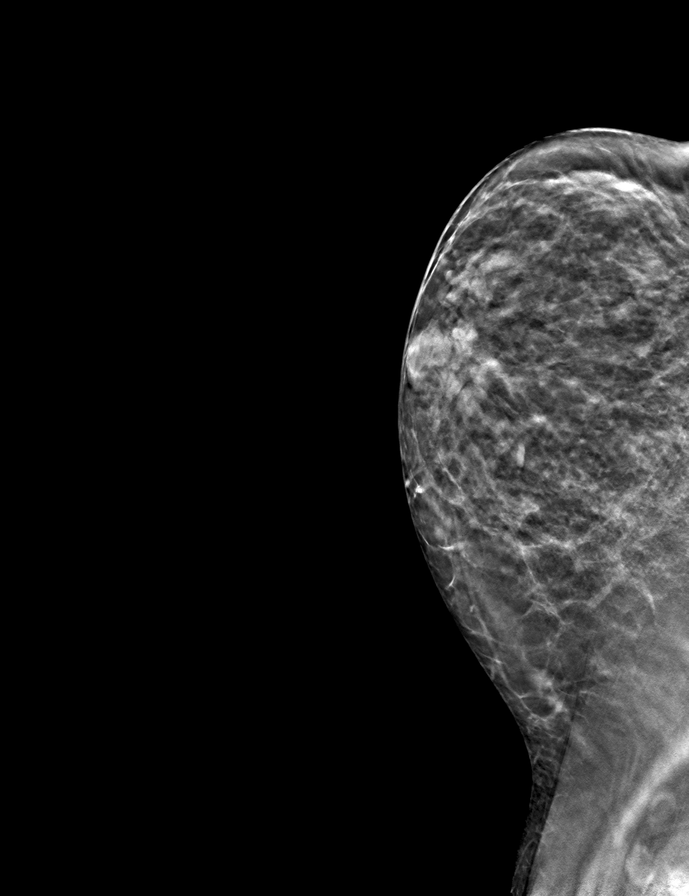

[R CC tomo · tomo slice 29/58.0]
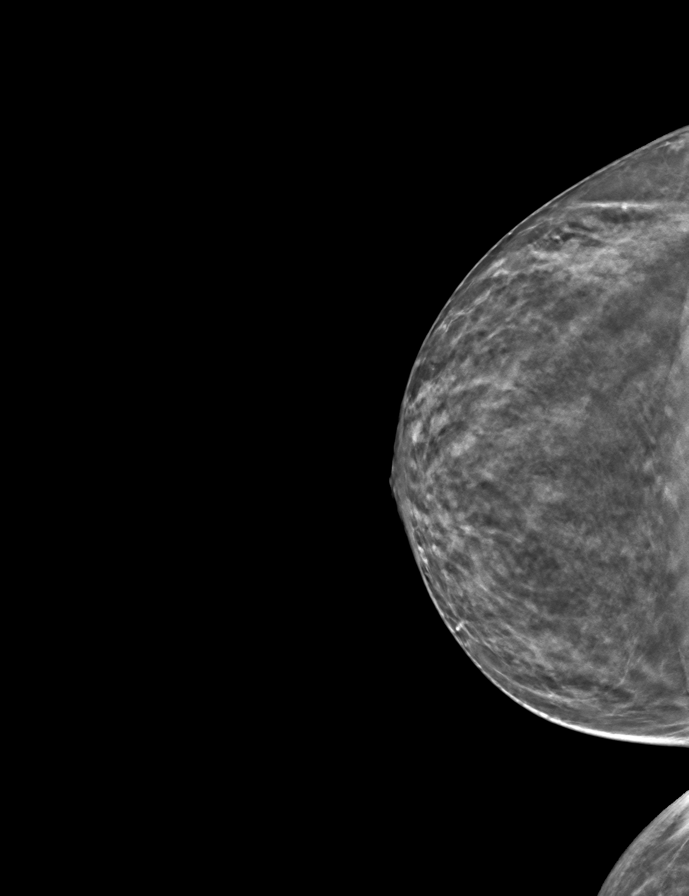

[L CC tomo · tomo slice 29/57.0]
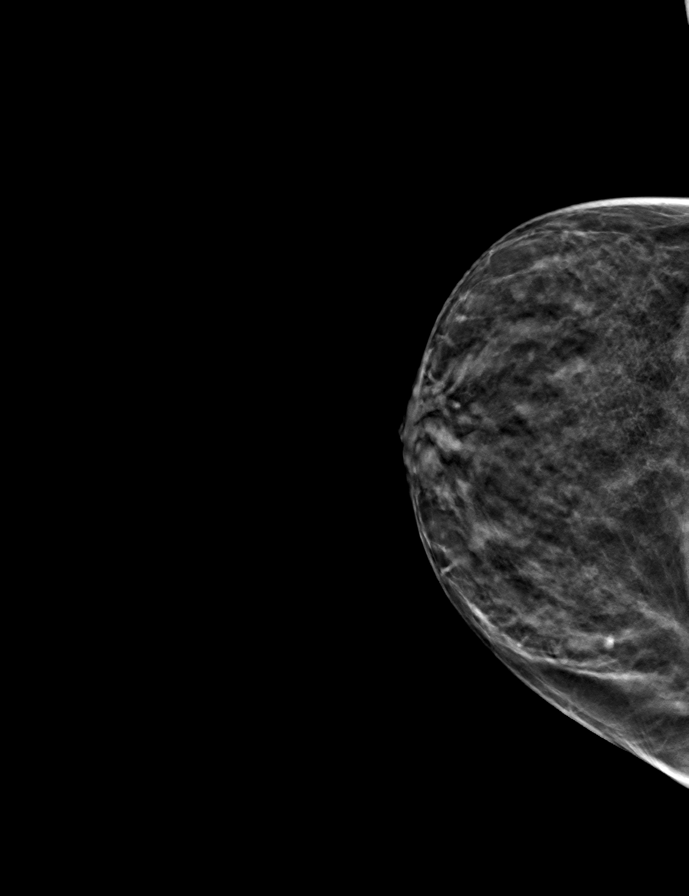

[9 of 24 positions shown; findings below may reference images not displayed]

ACR Breast Density Category c: The breast tissue is heterogeneously
dense, which may obscure small masses.
FINDINGS: There are no findings suspicious for malignancy. The images were
evaluated with computer-aided detection.
IMPRESSION: No mammographic evidence of malignancy. A result letter of this
screening mammogram will be mailed directly to the patient.

RECOMMENDATION:
Screening mammogram in one year. (Code:T4-5-GWO)

BI-RADS CATEGORY  1: Negative.

## 2021-09-02 ENCOUNTER — Ambulatory Visit: Payer: Self-pay | Admitting: *Deleted

## 2021-09-02 NOTE — Telephone Encounter (Signed)
Per agent: ?"Pt stated her energy has been off and schedule an appt for April 7th for left breast pain / please advise." ? ? ?Chief Complaint: Left breast pain ?Symptoms: Pain "For years, I have cysts" Increased fatigue, onset 2-3 months ago. ?Frequency: "years" Intermittent breast pain. ?Pertinent Negatives: Patient denies nipple discharge, lump, fever ?Disposition: [] ED /[] Urgent Care (no appt availability in office) / [x] Appointment(In office/virtual)/ []  [facility] [place] [facility]/ [] Home Care/ [] Refused Recommended Disposition /[]  Mobile Bus/ []  Follow-up with PCP ?Additional Notes: States "On medication for the pain, not working now." Naprosyn 500mg  ?Pt has appt 09/11/21. Advised to keep appt, call back for worsening symptoms. Pt verbalizes understanding,. ? ? ? ?Reason for Disposition ? [1] Breast pain AND [2] cause is not known ?   H/O "Cysts" ? ?Answer Assessment - Initial Assessment Questions ?1. SYMPTOM: "What's the main symptom you're concerned about?"  (e.g., lump, pain, rash, nipple discharge) ?    Left breast pain "For years" ?2. LOCATION: "Where is the *No Answer* located?" ?    *No Answer* ?3. ONSET: "When did *No Answer*  start?" ?    *No Answer* ?4. PRIOR HISTORY: "Do you have any history of prior problems with your breasts?" (e.g., lumps, cancer, fibrocystic breast disease) ?    *No Answer* ?5. CAUSE: "What do you think is causing this symptom?" ?    "Cysts" ?6. OTHER SYMPTOMS: "Do you have any other symptoms?" (e.g., fever, breast pain, redness or rash, nipple discharge) ?    Warm to touch at times, no energy at times 2-3 months. ? ?Protocols used: Breast Symptoms-A-AH ? ?

## 2021-09-08 ENCOUNTER — Ambulatory Visit: Payer: 59 | Admitting: Podiatry

## 2021-09-08 ENCOUNTER — Encounter: Payer: Self-pay | Admitting: Podiatry

## 2021-09-08 DIAGNOSIS — L989 Disorder of the skin and subcutaneous tissue, unspecified: Secondary | ICD-10-CM

## 2021-09-08 NOTE — Progress Notes (Signed)
? ?  Subjective: ?66 y.o. female presenting to the office today for evaluation of pain to the left fifth toe.  Patient does have a history of hammertoe repair to the fourth and fifth digits bilateral.  DOS: 04/06/2018.  She states that she is doing very well however she has developed a symptomatic callus to the fifth digit of the left foot that has been slowly developing over the past year.  She has been applying ointment on it that helps minimally.  She would like it evaluated and treated ? ? ?No past medical history on file. ? ? ?Objective:  ?Physical Exam ?General: Alert and oriented x3 in no acute distress ? ?Dermatology: Hyperkeratotic lesion(s) present on the dorsal lateral aspect of the fifth digit bilateral fifth digits and right 1st MTP. Pain on palpation noted. Skin is warm, dry and supple bilateral lower extremities. Negative for open lesions or macerations. ? ?Vascular: Palpable pedal pulses bilaterally. No edema or erythema noted. Capillary refill within normal limits. ? ?Neurological: Epicritic and protective threshold grossly intact bilaterally.  ? ?Musculoskeletal Exam: Pain on palpation at the keratotic lesion(s) noted. Range of motion within normal limits bilateral. Muscle strength 5/5 in all groups bilateral. ? ?Assessment: ?1.  Preulcerative callus/corn fifth digit bilateral. RT 1st MTP dorsal ?2. PSxHx bilateral foot surgery ? ? ?Plan of Care:  ?1. Patient evaluated ?2. Excisional debridement of keratoic lesion(s) using a chisel blade was performed without incident.  ?3. Salinocaine applied with a Band-Aid ?4.  Recommend OTC corn and callus remover daily under occlusion with a Band-Aid ?5.  Return to clinic as needed ? 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 ?Edrick Kins, DPM ?[place] ? ?Dr. Edrick Kins, DPM  ?  ?[address]                                     ?[place], [place] [address]                ?Office [phone]  ?Fax [phone] ? ? ? ? ? ?

## 2021-09-10 NOTE — Progress Notes (Signed)
?  ? ?  I,Roshena L Chambers,acting as a scribe for Mila Merry, MD.,have documented all relevant documentation on the behalf of Mila Merry, MD,as directed by  Mila Merry, MD while in the presence of Mila Merry, MD.  ? ?Established patient visit ? ? ?Patient: Kayla Allen   DOB: September 29, 1955   66 y.o. Female  MRN: [id number] ?Visit Date: 09/11/2021 ? ?Today's healthcare provider: Mila Merry, MD  ? ?Chief Complaint  ?Patient presents with  ? Breast Pain  ? ?Subjective  ?  ?HPI  ?Breast pain: ?Patient is here for evaluation of intermittent chronic left breast pain that has occurred for several years. She reports an increase in breast pain within in the past 2-3 months. She has tried taking Naprosyn 500mg  to help with pain, but reports no relief. Associated symptoms includes decreased energy levels. Last mammogram was 09/09/2020 and the result was BI-RADS CATEGORY  1: Negative. ? ? ?Medications: ?Outpatient Medications Prior to Visit  ?Medication Sig  ? BLACK COHOSH EXTRACT PO Take 1 tablet by mouth daily.  ? Multiple Vitamins-Minerals (MULTIVITAMIN WITH MINERALS) tablet Take 1 tablet by mouth daily.  ? buPROPion (WELLBUTRIN SR) 100 MG 12 hr tablet 1 tablet daily for 3 days, then 1 tablet twice daily. Stop smoking 14 days after starting medication  ? [DISCONTINUED] naproxen (NAPROSYN) 500 MG tablet Take 1 tablet (500 mg total) by mouth 2 (two) times daily with a meal. (Patient not taking: Reported on 09/11/2021)  ? ?No facility-administered medications prior to visit.  ? ? ?Review of Systems  ?Constitutional:  Positive for fatigue. Negative for appetite change, chills and fever.  ?Respiratory:  Negative for chest tightness and shortness of breath.   ?Cardiovascular:  Negative for chest pain and palpitations.  ?Gastrointestinal:  Negative for abdominal pain, nausea and vomiting.  ?Musculoskeletal:   ?     Left breast pain  ?Neurological:  Negative for dizziness and weakness.  ? ? ?  Objective  ?  ?BP 125/74  (BP Location: Left Arm, Patient Position: Sitting, Cuff Size: Normal)   Pulse 66   Temp 98.6 ?F (37 ?C) (Oral)   Resp 14   Wt 115 lb (52.2 kg)   SpO2 100% Comment: room air  BMI 18.56 kg/m?  ? ? ?Physical Exam  ? ?Tenderness of left lateral breast about 4 o'clock with vague swelling.  ? Assessment & Plan  ?  ? ?1. Breast pain, left ? ?- 11/11/2021 BREAST LTD UNI LEFT INC AXILLA; Future ?- MM 3D SCREEN BREAST BILATERAL; Future ? ?2. Encounter for screening mammogram for malignant neoplasm of breast ? ?- US BREAST LTD UNI LEFT INC AXILLA; Future ?- MM 3D SCREEN BREAST BILATERAL; Future   ?   ? ?The entirety of the information documented in the History of Present Illness, Review of Systems and Physical Exam were personally obtained by me. Portions of this information were initially documented by the CMA and reviewed by me for thoroughness and accuracy.   ? ? ?[place], MD  ?[facility] ?[phone] (phone) ?[phone] (fax) ? ?[facility]  ?

## 2021-09-11 ENCOUNTER — Encounter: Payer: Self-pay | Admitting: Family Medicine

## 2021-09-11 ENCOUNTER — Ambulatory Visit: Payer: 59 | Admitting: Family Medicine

## 2021-09-11 VITALS — BP 125/74 | HR 66 | Temp 98.6°F | Resp 14 | Wt 115.0 lb

## 2021-09-11 DIAGNOSIS — N644 Mastodynia: Secondary | ICD-10-CM

## 2021-09-11 DIAGNOSIS — Z1231 Encounter for screening mammogram for malignant neoplasm of breast: Secondary | ICD-10-CM

## 2021-09-16 ENCOUNTER — Telehealth: Payer: Self-pay

## 2021-09-16 DIAGNOSIS — Z1231 Encounter for screening mammogram for malignant neoplasm of breast: Secondary | ICD-10-CM

## 2021-09-16 DIAGNOSIS — N644 Mastodynia: Secondary | ICD-10-CM

## 2021-09-16 NOTE — Telephone Encounter (Signed)
Order entered

## 2021-09-16 NOTE — Telephone Encounter (Signed)
Copied from CRM [id number]. Topic: General - Other ?>> Sep 11, 2021  3:28 PM Maye Hides wrote: ?Reason for CRM: Per [facility] they will need mammogram order corrected to Diagnostic bilateral mammogram [id number] ?

## 2021-10-01 ENCOUNTER — Ambulatory Visit
Admission: RE | Admit: 2021-10-01 | Discharge: 2021-10-01 | Disposition: A | Discharge status: Home / Self Care | Payer: 59 | Source: Ambulatory Visit | Attending: Family Medicine | Admitting: Family Medicine

## 2021-10-01 DIAGNOSIS — N644 Mastodynia: Secondary | ICD-10-CM

## 2021-10-01 DIAGNOSIS — Z1231 Encounter for screening mammogram for malignant neoplasm of breast: Secondary | ICD-10-CM | POA: Insufficient documentation

## 2023-01-10 ENCOUNTER — Telehealth: Payer: Self-pay

## 2023-01-10 NOTE — Telephone Encounter (Unsigned)
Copied from CRM [id number]. Topic: Referral - Request for Referral >> Jan 10, 2023  2:46 PM Patsy Lager T wrote: Has patient seen PCP for this complaint? Yes.   *If NO, is insurance requiring patient see PCP for this issue before PCP can refer them? Referral for which specialty: radiology Preferred provider/office: unknown Reason for referral: mammogram

## 2023-01-10 NOTE — Telephone Encounter (Signed)
Copied from CRM [id number]. Topic: General - Inquiry >> Jan 10, 2023  2:45 PM Patsy Lager T wrote: Reason for CRM: patient called in requesting bloodwork for a Liver panel. Please f/u with patient

## 2023-02-01 ENCOUNTER — Ambulatory Visit (INDEPENDENT_AMBULATORY_CARE_PROVIDER_SITE_OTHER): Payer: 59 | Admitting: Family Medicine

## 2023-02-01 ENCOUNTER — Encounter: Payer: Self-pay | Admitting: Family Medicine

## 2023-02-01 VITALS — BP 141/84 | HR 57 | Temp 98.2°F | Resp 12 | Ht 66.0 in | Wt 121.4 lb

## 2023-02-01 DIAGNOSIS — Z1211 Encounter for screening for malignant neoplasm of colon: Secondary | ICD-10-CM

## 2023-02-01 DIAGNOSIS — R030 Elevated blood-pressure reading, without diagnosis of hypertension: Secondary | ICD-10-CM | POA: Diagnosis not present

## 2023-02-01 DIAGNOSIS — L723 Sebaceous cyst: Secondary | ICD-10-CM | POA: Diagnosis not present

## 2023-02-01 DIAGNOSIS — J439 Emphysema, unspecified: Secondary | ICD-10-CM

## 2023-02-01 DIAGNOSIS — Z23 Encounter for immunization: Secondary | ICD-10-CM | POA: Diagnosis not present

## 2023-02-01 DIAGNOSIS — F17210 Nicotine dependence, cigarettes, uncomplicated: Secondary | ICD-10-CM

## 2023-02-01 DIAGNOSIS — Z0000 Encounter for general adult medical examination without abnormal findings: Secondary | ICD-10-CM

## 2023-02-01 DIAGNOSIS — Z0001 Encounter for general adult medical examination with abnormal findings: Secondary | ICD-10-CM

## 2023-02-01 MED ORDER — BUPROPION HCL ER (SR) 100 MG PO TB12
ORAL_TABLET | ORAL | 5 refills | Status: DC
Start: 2023-02-01 — End: 2023-11-16

## 2023-02-01 NOTE — Progress Notes (Signed)
Complete physical exam   Patient: Kayla Allen   DOB: 06/10/1955   67 y.o. Female  MRN: [id number] Visit Date: 02/01/2023  Today's healthcare provider: Mila Merry, MD   Chief Complaint  Patient presents with   Annual Exam   Subjective    Discussed the use of AI scribe software for clinical note transcription with the patient, who gave verbal consent to proceed.  History of Present Illness   The patient, a Public relations account executive, presents for a routine physical and reports experiencing intermittent sharp pains on the left side of her abdomen for the past three months. The pain, which lasts for a few seconds, is not constant but occurs every two to three days. The patient is unsure if the pain is related to excessive lifting or bending at work. She also reports a cyst on her forehead that has been present for approximately four to five years. The cyst is not painful but is a cosmetic concern for her.  The patient is a current smoker, consuming about half a pack of cigarettes daily. She has made attempts to cut back on smoking and use candy as a substitute to alleviate cravings. She did not start bupropion prescribed a couple of years ago to aid in smoking cessation due to financial constraints at the time.  The patient also reports occasional swelling in her ankles, which she attributes to her work. She occasionally forgets to wear her compression socks, which she believes contributes to the swelling. The patient has not noticed any new lumps or sores in her breasts but mentions a history of cysts in the left breast. She has not had a biopsy in a while but was previously told that the cysts were not malignant.  The patient is due for a mammogram and colon cancer screening, and she has requested a referral to a dermatologist for the cyst on her forehead. She has also requested a liver panel due to the abdominal pain. The patient has not been monitoring her blood pressure at home but  acknowledges that it tends to rise during doctor's visits due to stress. She denies being a heavy salt user and expresses a desire to manage her blood pressure through dietary changes.       History reviewed. No pertinent past medical history. Past Surgical History:  Procedure Laterality Date   ABDOMINAL HYSTERECTOMY     CESAREAN SECTION     SALPINGECTOMY Right    Social History   Socioeconomic History   Marital status: Widowed    Spouse name: Not on file   Number of children: 1   Years of education: Not on file   Highest education level: Not on file  Occupational History   Occupation: [employer]    Comment: Biochemist, clinical  Tobacco Use   Smoking status: Every Day    Current packs/day: 0.50    Average packs/day: 0.5 packs/day for 40.0 years (20.0 ttl pk-yrs)    Types: Cigarettes   Smokeless tobacco: Never   Tobacco comments:    started smoking at age 11; was smoking 1ppd for several years, but has recently cut back to 1/2 ppd  Substance and Sexual Activity   Alcohol use: Yes    Comment: drinks once a month   Drug use: No   Sexual activity: Not on file  Other Topics Concern   Not on file  Social History Narrative   Not on file   Social Determinants of Health   Financial  Resource Strain: Not on file  Food Insecurity: Not on file  Transportation Needs: Not on file  Physical Activity: Not on file  Stress: Not on file  Social Connections: Not on file  Intimate Partner Violence: Not on file   Family Status  Relation Name Status   Mother  Alive   Father  Deceased at age 36       lung cancer   Sister  Alive   Brother  Alive   Son  Alive   MGM  Alive   MGF  Deceased   PGF  Deceased   Sister  Alive   Sister  Alive   Brother  Alive   Brother  Alive   Mat Aunt  Deceased  No partnership data on file   Family History  Problem Relation Age of Onset   Diabetes Mother    Hypertension Mother    Lung cancer Father    Diabetes Sister     Hypothyroidism Sister    Asthma Son    Diabetes Son    Diabetes Maternal Grandmother    Congestive Heart Failure Maternal Grandfather    Prostate cancer Paternal Grandfather    Breast cancer Maternal Aunt    Allergies  Allergen Reactions   Penicillins Hives    Patient Care Team: Malva Limes, MD as PCP - General (Family Medicine) Lemar Livings, Merrily Pew, MD (General Surgery) Malva Limes, MD as Referring Physician (Family Medicine)   Medications: Outpatient Medications Prior to Visit  Medication Sig   BLACK COHOSH EXTRACT PO Take 1 tablet by mouth daily.   Multiple Vitamins-Minerals (MULTIVITAMIN WITH MINERALS) tablet Take 1 tablet by mouth daily.   buPROPion (WELLBUTRIN SR) 100 MG 12 hr tablet 1 tablet daily for 3 days, then 1 tablet twice daily. Stop smoking 14 days after starting medication (Patient not taking: Reported on 02/01/2023)   No facility-administered medications prior to visit.    Review of Systems  Constitutional:  Negative for chills, diaphoresis and fever.  HENT:  Negative for congestion, ear discharge, ear pain, hearing loss, nosebleeds, sore throat and tinnitus.   Eyes:  Negative for photophobia, pain, discharge and redness.  Respiratory:  Negative for cough, shortness of breath, wheezing and stridor.   Cardiovascular:  Negative for chest pain, palpitations and leg swelling.  Gastrointestinal:  Negative for abdominal pain, blood in stool, constipation, diarrhea, nausea and vomiting.  Endocrine: Negative for polydipsia.  Genitourinary:  Negative for dysuria, flank pain, frequency, hematuria and urgency.  Musculoskeletal:  Negative for back pain, myalgias and neck pain.  Skin:  Negative for rash.  Allergic/Immunologic: Negative for environmental allergies.  Neurological:  Negative for dizziness, tremors, seizures, weakness and headaches.  Hematological:  Does not bruise/bleed easily.  Psychiatric/Behavioral:  Negative for hallucinations and suicidal ideas.  The patient is not nervous/anxious.      Objective    BP (!) 141/84 (BP Location: Right Arm, Patient Position: Sitting, Cuff Size: Normal)   Pulse (!) 57   Temp 98.2 F (36.8 C) (Temporal)   Resp 12   Ht 5\' 6"  (1.676 m)   Wt 121 lb 6.4 oz (55.1 kg)   SpO2 99%   BMI 19.59 kg/m    Physical Exam   General Appearance:    Thin female. Alert, cooperative, in no acute distress, appears stated age   Head:    Normocephalic, without obvious abnormality, atraumatic  Eyes:    PERRL, conjunctiva/corneas clear, EOM's intact, fundi    benign, both eyes  Ears:  Normal TM's and external ear canals, both ears  Nose:   Nares normal, septum midline, mucosa normal, no drainage    or sinus tenderness  Throat:   Lips, mucosa, and tongue normal; teeth and gums normal  Neck:   Supple, symmetrical, trachea midline, no adenopathy;    thyroid:  no enlargement/tenderness/nodules; no carotid   bruit or JVD  Back:     Symmetric, no curvature, ROM normal, no CVA tenderness  Lungs:     Clear to auscultation bilaterally, respirations unlabored  Chest Wall:    No tenderness or deformity   Heart:    Bradycardic. Normal rhythm. No murmurs, rubs, or gallops.   Breast Exam:    deferred  Abdomen:     Soft, non-tender, bowel sounds active all four quadrants,    no masses, no organomegaly  Pelvic:    deferred  Extremities:   All extremities are intact. No cyanosis or edema  Pulses:   2+ and symmetric all extremities  Skin:   Skin color, texture, turgor normal, no rashes or lesions  Lymph nodes:   Cervical, supraclavicular, and axillary nodes normal  Neurologic:   CNII-XII intact, normal strength, sensation and reflexes    throughout     Last depression screening scores    02/01/2023   11:14 AM 09/11/2021    1:19 PM 08/20/2020   10:07 AM  PHQ 2/9 Scores  PHQ - 2 Score 2 2 1   PHQ- 9 Score 4 6 5    Last fall risk screening    02/01/2023   11:14 AM  Fall Risk   Falls in the past year? 0  Number falls in  past yr: 0  Injury with Fall? 0  Risk for fall due to : No Fall Risks  Follow up Falls evaluation completed   Last Audit-C alcohol use screening    08/20/2020   10:07 AM  Alcohol Use Disorder Test (AUDIT)  1. How often do you have a drink containing alcohol? 1  2. How many drinks containing alcohol do you have on a typical day when you are drinking? 0  3. How often do you have six or more drinks on one occasion? 0  AUDIT-C Score 1  Alcohol Brief Interventions/Follow-up AUDIT Score <7 follow-up not indicated   A score of 3 or more in women, and 4 or more in men indicates increased risk for alcohol abuse, EXCEPT if all of the points are from question 1     Assessment & Plan    Routine Health Maintenance and Physical Exam  Exercise Activities and Dietary recommendations  Goals   None     Immunization History  Administered Date(s) Administered   Moderna Sars-Covid-2 Vaccination 08/24/2019, 10/28/2019, 06/20/2020   PPD Test 01/28/2016   Tdap 08/13/2016   Zoster Recombinant(Shingrix) 08/20/2020   Zoster, Live 04/11/2015    Health Maintenance  Topic Date Due   Lung Cancer Screening  08/30/2017   Zoster Vaccines- Shingrix (2 of 2) 10/15/2020   Colonoscopy  10/21/2020   COVID-19 Vaccine (4 - 2023-24 season) 02/05/2022   INFLUENZA VACCINE  09/05/2023 (Originally 01/06/2023)   HIV Screening  08/14/2027 (Originally 05/10/1973)   PAP SMEAR-Modifier  08/21/2023   MAMMOGRAM  10/02/2023   DTaP/Tdap/Td (2 - Td or Tdap) 08/14/2026   Hepatitis C Screening  Completed   HPV VACCINES  Aged Out    Discussed health benefits of physical activity, and encouraged her to engage in regular exercise appropriate for her age and condition.    ***  Sebaceous Cyst Present for approximately 4-5 years on forehead. No pain, but causing cosmetic concern. -Refer to dermatology for evaluation and possible removal.  Intermittent Left-sided Abdominal Pain Described as shooting pain, occurring every 2-3  days, lasting a few seconds. Noted for the past 3 months. -Order liver panel to evaluate for any liver abnormalities. -Order CT scan for lung screening, which will also visualize upper portion of liver.  Smoking Cessation Currently smoking half a pack per day. Previously prescribed Wellbutrin but was not taken due to financial constraints. -Prescribe Wellbutrin for smoking cessation. -Recommend obtaining a lung screening CT scan.  Breast Cysts History of benign breast cysts. No new lumps or changes noted. -Order mammogram and provide referral.  Hypertension Elevated blood pressure noted during visit, possibly due to white coat syndrome. No home monitoring. -Advise on lifestyle modifications including low sodium diet. -Plan to monitor blood pressure in future visits.  Colon Cancer Screening Overdue for screening. -Refer for colonoscopy.   Needs Shingles Vaccine -Complete second dose of shingles vaccine today.     Emphysema noted on previous LDCT Encourage smoking cessation as above        Mila Merry, MD  [facility] [phone] (phone) [phone] (fax)  [facility]

## 2023-02-01 NOTE — Patient Instructions (Addendum)
Please review the attached list of medications and notify my office if there are any errors.   Please bring all of your medications to every appointment so we can make sure that our medication list is the same as yours.   Please call the [facility] [phone]) to schedule a routine screening mammogram.  Please contact your eyecare professional to schedule a routine eye exam. I recommend seeing Dr. Edger House at [phone] or the [facility] at [phone]

## 2023-02-02 LAB — CBC
Hematocrit: 40.2 % (ref 34.0–46.6)
Hemoglobin: 13.2 g/dL (ref 11.1–15.9)
MCH: 27.9 pg (ref 26.6–33.0)
MCHC: 32.8 g/dL (ref 31.5–35.7)
MCV: 85 fL (ref 79–97)
Platelets: 128 10*3/uL — ABNORMAL LOW (ref 150–450)
RBC: 4.73 x10E6/uL (ref 3.77–5.28)
RDW: 12.1 % (ref 11.7–15.4)
WBC: 7 10*3/uL (ref 3.4–10.8)

## 2023-02-02 LAB — COMPREHENSIVE METABOLIC PANEL
ALT: 22 IU/L (ref 0–32)
AST: 21 IU/L (ref 0–40)
Albumin: 4.4 g/dL (ref 3.9–4.9)
Alkaline Phosphatase: 142 IU/L — ABNORMAL HIGH (ref 44–121)
BUN/Creatinine Ratio: 16 (ref 12–28)
BUN: 14 mg/dL (ref 8–27)
Bilirubin Total: 0.4 mg/dL (ref 0.0–1.2)
CO2: 23 mmol/L (ref 20–29)
Calcium: 9.6 mg/dL (ref 8.7–10.3)
Chloride: 106 mmol/L (ref 96–106)
Creatinine, Ser: 0.87 mg/dL (ref 0.57–1.00)
Globulin, Total: 2.5 g/dL (ref 1.5–4.5)
Glucose: 131 mg/dL — ABNORMAL HIGH (ref 70–99)
Potassium: 4.1 mmol/L (ref 3.5–5.2)
Sodium: 145 mmol/L — ABNORMAL HIGH (ref 134–144)
Total Protein: 6.9 g/dL (ref 6.0–8.5)
eGFR: 74 mL/min/{1.73_m2} (ref >59)

## 2023-02-02 LAB — LIPID PANEL
Chol/HDL Ratio: 3.4 ratio (ref 0.0–4.4)
Cholesterol, Total: 151 mg/dL (ref 100–199)
HDL: 45 mg/dL (ref >39)
LDL Chol Calc (NIH): 62 mg/dL (ref 0–99)
Triglycerides: 280 mg/dL — ABNORMAL HIGH (ref 0–149)
VLDL Cholesterol Cal: 44 mg/dL — ABNORMAL HIGH (ref 5–40)

## 2023-02-02 NOTE — Progress Notes (Signed)
Called [facility] to add A1c test # [id number].

## 2023-02-03 ENCOUNTER — Encounter: Payer: Self-pay | Admitting: Family Medicine

## 2023-02-03 ENCOUNTER — Other Ambulatory Visit: Payer: Self-pay | Admitting: Family Medicine

## 2023-02-03 DIAGNOSIS — E119 Type 2 diabetes mellitus without complications: Secondary | ICD-10-CM | POA: Insufficient documentation

## 2023-02-03 MED ORDER — METFORMIN HCL ER 500 MG PO TB24: 500.0000 mg | ORAL_TABLET | Freq: Every day | ORAL | 3 refills | Status: DC

## 2023-02-04 LAB — HGB A1C W/O EAG: Hgb A1c MFr Bld: 7.8 % — ABNORMAL HIGH (ref 4.8–5.6)

## 2023-02-04 LAB — SPECIMEN STATUS REPORT

## 2023-02-15 ENCOUNTER — Encounter: Payer: Self-pay | Admitting: *Deleted

## 2023-11-16 ENCOUNTER — Ambulatory Visit: Admitting: Family Medicine

## 2023-11-16 ENCOUNTER — Encounter: Payer: Self-pay | Admitting: Family Medicine

## 2023-11-16 VITALS — BP 104/69 | HR 72 | Temp 98.2°F | Ht 66.0 in | Wt 101.2 lb

## 2023-11-16 DIAGNOSIS — F17210 Nicotine dependence, cigarettes, uncomplicated: Secondary | ICD-10-CM | POA: Diagnosis not present

## 2023-11-16 DIAGNOSIS — Z1211 Encounter for screening for malignant neoplasm of colon: Secondary | ICD-10-CM

## 2023-11-16 DIAGNOSIS — E119 Type 2 diabetes mellitus without complications: Secondary | ICD-10-CM

## 2023-11-16 DIAGNOSIS — R634 Abnormal weight loss: Secondary | ICD-10-CM | POA: Diagnosis not present

## 2023-11-16 DIAGNOSIS — Z7984 Long term (current) use of oral hypoglycemic drugs: Secondary | ICD-10-CM | POA: Diagnosis not present

## 2023-11-16 LAB — POCT GLYCOSYLATED HEMOGLOBIN (HGB A1C)
Est. average glucose Bld gHb Est-mCnc: >326
Hemoglobin A1C: 15 % — AB (ref 4.0–5.6)

## 2023-11-16 MED ORDER — GLIPIZIDE 5 MG PO TABS: ORAL_TABLET | ORAL | 1 refills | Status: DC

## 2023-11-16 MED ORDER — ONETOUCH ULTRA 2 W/DEVICE KIT: PACK | 0 refills | Status: AC

## 2023-11-16 MED ORDER — ONETOUCH ULTRA VI STRP: ORAL_STRIP | 4 refills | Status: AC

## 2023-11-16 NOTE — Patient Instructions (Signed)
 Marland Kitchen  Please review the attached list of medications and notify my office if there are any errors.   . Please bring all of your medications to every appointment so we can make sure that our medication list is the same as yours.

## 2023-11-17 LAB — MICROALBUMIN / CREATININE URINE RATIO
Creatinine, Urine: 75.1 mg/dL
Microalb/Creat Ratio: 25 mg/g{creat} (ref 0–29)
Microalbumin, Urine: 18.9 ug/mL

## 2023-11-29 NOTE — Progress Notes (Signed)
 Established patient visit   Patient: ZOANNE Allen   DOB: Feb 09, 1956   68 y.o. Female  MRN: [id number] Visit Date: 11/16/2023  Today's healthcare provider: Nancyann Perry, MD   Chief Complaint  Patient presents with   Medical Management of Chronic Issues   Subjective    Discussed the use of AI scribe software for clinical note transcription with the patient, who gave verbal consent to proceed.  History of Present Illness   Kayla Allen is a 68 year old female with diabetes who presents with uncontrolled blood sugar levels and weight loss.  She has been experiencing consistently high blood sugar levels despite being on metformin . Her blood sugar was over 300 mg/dL last Sunday, as checked by her mother. She has not been provided with a glucose meter to monitor her blood sugar levels at home.  She has been experiencing significant weight loss and frequent urination. She feels generally unwell, describing her condition as feeling 'like crap.' No current stomach pains, cramping, or nausea. She initially experienced some nausea when she first started metformin , but this has since resolved.  She has not been on any other diabetes medications besides metformin . Her daily routine includes a protein shake for breakfast, a banana mid-morning, and a small lunch break at work. 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     Wt Readings from Last 4 Encounters:  11/16/23 101 lb 3.2 oz (45.9 kg)  02/01/23 121 lb 6.4 oz (55.1 kg)  09/11/21 115 lb (52.2 kg)  08/20/20 119 lb 3.2 oz (54.1 kg)   BP Readings from Last 3 Encounters:  11/16/23 104/69  02/01/23 (!) 141/84  09/11/21 125/74     Medications: Outpatient Medications Prior to Visit  Medication Sig   BLACK COHOSH EXTRACT PO Take 1 tablet by mouth daily.   Multiple Vitamins-Minerals (MULTIVITAMIN WITH MINERALS) tablet Take 1 tablet by mouth daily.   [DISCONTINUED] metFORMIN   (GLUCOPHAGE -XR) 500 MG 24 hr tablet Take 1 tablet (500 mg total) by mouth daily with breakfast.   [DISCONTINUED] buPROPion  ER (WELLBUTRIN  SR) 100 MG 12 hr tablet 1 tablet daily for 3 days, then 1 tablet twice daily. Stop smoking 14 days after starting medication   No facility-administered medications prior to visit.       Objective    BP 104/69 (BP Location: Left Arm, Patient Position: Sitting, Cuff Size: Small)   Pulse 72   Temp 98.2 F (36.8 C) (Oral)   Ht 5' 6 (1.676 m)   Wt 101 lb 3.2 oz (45.9 kg)   SpO2 100%   BMI 16.33 kg/m   Physical Exam   General: Appearance:    Thin female in no acute distress  Eyes:    PERRL, conjunctiva/corneas clear, EOM's intact       Lungs:     Clear to auscultation bilaterally, respirations unlabored  Heart:    Normal heart rate. Normal rhythm. No murmurs, rubs, or gallops.    MS:   All extremities are intact.    Neurologic:   Awake, alert, oriented x 3. No apparent focal neurological defect.         Results for orders placed or performed in visit on 11/16/23  POCT glycosylated hemoglobin (Hb A1C)  Result Value Ref Range   Hemoglobin A1C 15.0 (A) 4.0 - 5.6 %   Est. average glucose Bld gHb Est-mCnc >326      Assessment & Plan  Type 2 Diabetes Mellitus with abnormal weight loss Blood sugar poorly controlled, A1c increased to 15.0. Metformin  ineffective. Glipizide  chosen for better control. Discussed hypoglycemia risk and side effects. Glipizide  generally well tolerated. - Prescribe glipizide , start one tablet daily, increase to twice daily after one week. - Discontinue metformin . - Prescribe blood glucose meter and test strips. - Schedule follow-up in four weeks to assess weight gain and blood sugar control.  Eye Examination No recent eye examination. Needs monitoring for diabetic retinopathy. - Refer to ophthalmologist for comprehensive eye examination.  Colorectal Cancer Screening Due for screening, prefers Cologuard over  colonoscopy. - Order Cologuard test for colorectal cancer screening.  Lung Cancer Screening Due for screening, no recent CT scan. - Order CT scan for lung cancer screening.    Return in about 4 weeks (around 12/14/2023).     Nancyann Perry, MD  [facility] [phone] (phone) [phone] (fax)  [facility]

## 2023-12-19 ENCOUNTER — Ambulatory Visit: Admitting: Family Medicine

## 2023-12-19 ENCOUNTER — Encounter: Payer: Self-pay | Admitting: Family Medicine

## 2023-12-19 VITALS — BP 135/75 | HR 75 | Resp 16 | Wt 108.0 lb

## 2023-12-19 DIAGNOSIS — R634 Abnormal weight loss: Secondary | ICD-10-CM

## 2023-12-19 DIAGNOSIS — Z7984 Long term (current) use of oral hypoglycemic drugs: Secondary | ICD-10-CM

## 2023-12-19 DIAGNOSIS — E11649 Type 2 diabetes mellitus with hypoglycemia without coma: Secondary | ICD-10-CM | POA: Diagnosis not present

## 2023-12-19 LAB — POCT GLYCOSYLATED HEMOGLOBIN (HGB A1C)
Est. average glucose Bld gHb Est-mCnc: >326
Hemoglobin A1C: 14.8 % — AB (ref 4.0–5.6)

## 2023-12-19 LAB — GLUCOSE, POCT (MANUAL RESULT ENTRY)

## 2023-12-19 NOTE — Progress Notes (Signed)
      Established patient visit   Patient: Kayla Allen   DOB: February 16, 1956   68 y.o. Female  MRN: [id number] Visit Date: 12/19/2023  Today's healthcare provider: Nancyann Perry, MD   Chief Complaint  Patient presents with   Medical Management of Chronic Issues    four weeks f/u to assess weight gain and blood sugar control.   Subjective    HPI Follow up diabetes and loss of weight since starting glipizide  a month ago when her A1c was found to be 15. His titrated up to 5mg  twice a day and tolerating well. Sugars still usually in the 300s at home. Is feeling a bit better.   Wt Readings from Last 3 Encounters:  12/19/23 108 lb (49 kg)  11/16/23 101 lb 3.2 oz (45.9 kg)  02/01/23 121 lb 6.4 oz (55.1 kg)    Medications: Outpatient Medications Prior to Visit  Medication Sig   BLACK COHOSH EXTRACT PO Take 1 tablet by mouth daily.   Blood Glucose Monitoring Suppl (ONE TOUCH ULTRA 2) w/Device KIT Use to check sugar daily for type 2 diabetes E11.9   glipiZIDE  (GLUCOTROL ) 5 MG tablet Take one tablet in the morning for 7 days, then increase to twice a day with meals   glucose blood (ONETOUCH ULTRA) test strip Use to check blood sugar daily for type 2 diabetes E11.9   Multiple Vitamins-Minerals (MULTIVITAMIN WITH MINERALS) tablet Take 1 tablet by mouth daily.   No facility-administered medications prior to visit.       Objective    BP 135/75 (BP Location: Left Arm, Patient Position: Sitting, Cuff Size: Small)   Pulse 75   Resp 16   Wt 108 lb (49 kg)   SpO2 98%   BMI 17.43 kg/m    Physical Exam   General appearance: Thin female, cooperative and in no acute distress Head: Normocephalic, without obvious abnormality, atraumatic Respiratory: Respirations even and unlabored, normal respiratory rate Extremities: All extremities are intact.  Skin: Skin color, texture, turgor normal. No rashes seen  Psych: Appropriate mood and affect. Neurologic: Mental status: Alert, oriented to  person, place, and time, thought content appropriate.   Results for orders placed or performed in visit on 12/19/23  POCT glycosylated hemoglobin (Hb A1C)  Result Value Ref Range   Hemoglobin A1C 14.8 (A) 4.0 - 5.6 %   Est. average glucose Bld gHb Est-mCnc >326   POCT glucose (manual entry)  Result Value Ref Range   POC Glucose E-L 70 - 99 mg/dl    Assessment & Plan     1. Abnormal weight loss (Primary) Likely secondary to uncontrolled diabetes, 7 pound weight gain since starting glipizide .   2. Uncontrolled type 2 diabetes mellitus with hypoglycemia without coma (HCC) Tolerating initiation of glipizide  with a bit of improvement in weight and A1c. She does not tolerate metformin . Consider adding a gliptan and pioglitazone  to regiment after reviewing labs.   - Renal function panel         Nancyann Perry, MD  [facility] [phone] (phone) [phone] (fax)  [facility]

## 2023-12-20 ENCOUNTER — Other Ambulatory Visit: Payer: Self-pay | Admitting: Family Medicine

## 2023-12-20 ENCOUNTER — Ambulatory Visit: Payer: Self-pay | Admitting: Family Medicine

## 2023-12-20 DIAGNOSIS — E119 Type 2 diabetes mellitus without complications: Secondary | ICD-10-CM

## 2023-12-20 LAB — RENAL FUNCTION PANEL
Albumin: 4.2 g/dL (ref 3.9–4.9)
BUN/Creatinine Ratio: 18 (ref 12–28)
BUN: 14 mg/dL (ref 8–27)
CO2: 21 mmol/L (ref 20–29)
Calcium: 9.5 mg/dL (ref 8.7–10.3)
Chloride: 101 mmol/L (ref 96–106)
Creatinine, Ser: 0.79 mg/dL (ref 0.57–1.00)
Glucose: 415 mg/dL — ABNORMAL HIGH (ref 70–99)
Phosphorus: 3.5 mg/dL (ref 3.0–4.3)
Potassium: 4.4 mmol/L (ref 3.5–5.2)
Sodium: 139 mmol/L (ref 134–144)
eGFR: 83 mL/min/1.73 (ref >59)

## 2023-12-20 MED ORDER — SAXAGLIPTIN HCL 5 MG PO TABS: 5.0000 mg | ORAL_TABLET | Freq: Every day | ORAL | 2 refills | Status: DC

## 2023-12-20 MED ORDER — PIOGLITAZONE HCL 30 MG PO TABS: 30.0000 mg | ORAL_TABLET | Freq: Every day | ORAL | 2 refills | Status: DC

## 2023-12-20 MED ORDER — GLIPIZIDE 5 MG PO TABS: 5.0000 mg | ORAL_TABLET | Freq: Two times a day (BID) | ORAL | 2 refills | Status: DC

## 2023-12-28 NOTE — Telephone Encounter (Signed)
 Copied from CRM [id number]. Topic: Clinical - Lab/Test Results >> Dec 27, 2023  4:49 PM Selinda RAMAN wrote: Reason for CRM: The patient called in for her lab results and I relayed what the provider said. She still has not picked up her medicines that were called in last week and she will pick them up as soon as possible to start those. She will also call back to schedule her 1 month follow up after she checks her schedule

## 2024-01-04 LAB — COLOGUARD

## 2024-01-25 LAB — COLOGUARD

## 2024-01-31 LAB — HM DIABETES EYE EXAM

## 2024-02-01 ENCOUNTER — Other Ambulatory Visit: Payer: Self-pay | Admitting: Family Medicine

## 2024-02-01 DIAGNOSIS — E119 Type 2 diabetes mellitus without complications: Secondary | ICD-10-CM

## 2024-02-23 ENCOUNTER — Ambulatory Visit: Payer: Self-pay | Admitting: Family Medicine

## 2024-02-23 LAB — COLOGUARD

## 2024-02-24 ENCOUNTER — Ambulatory Visit: Payer: Self-pay

## 2024-02-24 NOTE — Telephone Encounter (Signed)
 FYI Only or Action Required?: FYI only for provider.  Patient was last seen in primary care on 12/19/2023 by Gasper Nancyann BRAVO, MD.  Called Nurse Triage reporting Hyperglycemia.  Symptoms began yesterday.  Interventions attempted: Prescription medications: taking her diabetes medication.  Symptoms are: improved, no symptoms today.  Triage Disposition: Home Care  Patient/caregiver understands and will follow disposition?: Yes with modifications, appointment scheduled per patient request to discuss diabetes management       Copied from CRM #[id number]. Topic: Clinical - Red Word Triage >> Feb 24, 2024  4:22 PM Wess RAMAN wrote: Red Word that prompted transfer to Nurse Triage: High Blood sugar Readings: 327, 287. Has not gotten below 250      Reason for Disposition  [1] Blood glucose 240 - 300 mg/dL (86.6 - 83.2 mmol/L) AND [2] does not use insulin (e.g., not insulin-dependent; most people with type 2 diabetes)  Answer Assessment - Initial Assessment Questions 1. BLOOD GLUCOSE: What is your blood glucose level?      327 yesterday, has not checked today but doesn't have any symptoms  2. ONSET: When did you check the blood glucose?     Yesterday     4. KETONES: Do you check for ketones (urine or blood test strips)? If Yes, ask: What does the test show now?      No 5. TYPE 1 or 2:  Do you know what type of diabetes you have?  (e.g., Type 1, Type 2, Gestational; doesn't know)      Type 2 6. INSULIN: Do you take insulin? What type of insulin(s) do you use? What is the mode of delivery? (syringe, pen; injection or pump)?      No 7. DIABETES PILLS: Do you take any pills for your diabetes? If Yes, ask: Have you missed taking any pills recently?     Takes pills, has not missed a dose  8. OTHER SYMPTOMS: Do you have any symptoms? (e.g., fever, frequent urination, difficulty breathing, dizziness, weakness, vomiting)     Denies any symptoms  Protocols used: Diabetes -  High Blood Sugar-A-AH

## 2024-02-27 ENCOUNTER — Ambulatory Visit: Admitting: Family Medicine

## 2024-02-27 ENCOUNTER — Encounter: Payer: Self-pay | Admitting: Family Medicine

## 2024-02-27 VITALS — BP 145/89 | HR 71 | Resp 16 | Ht 66.0 in | Wt 107.0 lb

## 2024-02-27 DIAGNOSIS — R634 Abnormal weight loss: Secondary | ICD-10-CM

## 2024-02-27 DIAGNOSIS — Z7984 Long term (current) use of oral hypoglycemic drugs: Secondary | ICD-10-CM | POA: Diagnosis not present

## 2024-02-27 DIAGNOSIS — E119 Type 2 diabetes mellitus without complications: Secondary | ICD-10-CM

## 2024-02-27 DIAGNOSIS — Z23 Encounter for immunization: Secondary | ICD-10-CM | POA: Diagnosis not present

## 2024-02-27 NOTE — Progress Notes (Signed)
 Established patient visit   Patient: Kayla Allen   DOB: [birth year]   68 y.o. Female  MRN: [id number] Visit Date: 02/27/2024  Today's healthcare provider: Nancyann Perry, MD   Chief Complaint  Patient presents with   Acute Visit    High Blood sugars   Subjective    Discussed the use of AI scribe software for clinical note transcription with the patient, who gave verbal consent to proceed.  History of Present Illness   Kayla Allen is a 68 year old female with type 2 diabetes who presents for follow-up of her blood sugar management.  She was seen in April with weight loss , upset stomach and an A1c of 15mg . She was started on glipizide  and metformin  was stopped. A1c was 14.8% at in July and was started on pioglitazone  and saxagliptin .   Her blood sugar levels remain elevated, with a fasting reading of 204 mg/dL this morning. Last Thursday afternoon, she experienced a high reading of 315 mg/dL after feeling 'funny'. Her hemoglobin A1c has decreased from 15% to 12%. She describes her condition as having 'some days okay' and other days similar to before starting medication. She has also noticed weight gain, which has now stabilized.  She is currently taking glipizide  5 mg twice daily and pioglitazone  30 mg, which she reports increases her appetite. She ran out of pioglitazone  on Saturday and plans to refill it.     Lab Results  Component Value Date   HGBA1C 14.8 (A) 12/19/2023   HGBA1C 15.0 (A) 11/16/2023   HGBA1C 7.8 (H) 02/01/2023   Wt Readings from Last 6 Encounters:  02/27/24 107 lb (48.5 kg)  12/19/23 108 lb (49 kg)  11/16/23 101 lb 3.2 oz (45.9 kg)  02/01/23 121 lb 6.4 oz (55.1 kg)  09/11/21 115 lb (52.2 kg)  08/20/20 119 lb 3.2 oz (54.1 kg)   Lab Results  Component Value Date   NA 139 12/19/2023   CL 101 12/19/2023   K 4.4 12/19/2023   CO2 21 12/19/2023   BUN 14 12/19/2023   CREATININE 0.79 12/19/2023   EGFR 83 12/19/2023   CALCIUM 9.5 12/19/2023    PHOS 3.5 12/19/2023   ALBUMIN 4.2 12/19/2023   GLUCOSE 415 (H) 12/19/2023    Medications: Outpatient Medications Prior to Visit  Medication Sig   BLACK COHOSH EXTRACT PO Take 1 tablet by mouth daily.   Blood Glucose Monitoring Suppl (ONE TOUCH ULTRA 2) w/Device KIT Use to check sugar daily for type 2 diabetes E11.9   glipiZIDE  (GLUCOTROL ) 5 MG tablet Take 1 tablet (5 mg total) by mouth 2 (two) times daily before a meal.   glucose blood (ONETOUCH ULTRA) test strip Use to check blood sugar daily for type 2 diabetes E11.9   Multiple Vitamins-Minerals (MULTIVITAMIN WITH MINERALS) tablet Take 1 tablet by mouth daily.   pioglitazone  (ACTOS ) 30 MG tablet TAKE 1 TABLET(30 MG) BY MOUTH DAILY   saxagliptin  HCl (ONGLYZA) 5 MG TABS tablet Take 1 tablet (5 mg total) by mouth daily.   No facility-administered medications prior to visit.   Review of Systems  Constitutional:  Positive for fatigue. Negative for appetite change, chills and fever.  Respiratory:  Negative for chest tightness and shortness of breath.   Cardiovascular:  Negative for chest pain and palpitations.  Gastrointestinal:  Negative for abdominal pain, nausea and vomiting.  Neurological:  Negative for dizziness and weakness.       Objective    BP (!) 145/89 (  BP Location: Right Arm, Patient Position: Sitting, Cuff Size: Small)   Pulse 71   Resp 16   Ht 5' 6 (1.676 m)   Wt 107 lb (48.5 kg)   SpO2 100%   BMI 17.27 kg/m   Physical Exam   General appearance: Underweight female, cooperative and in no acute distress Head: Normocephalic, without obvious abnormality, atraumatic Respiratory: Respirations even and unlabored, normal respiratory rate Extremities: All extremities are intact.  Skin: Skin color, texture, turgor normal. No rashes seen  Psych: Appropriate mood and affect. Neurologic: Mental status: Alert, oriented to person, place, and time, thought content appropriate.    Assessment & Plan       1. Diabetes  mellitus without complication (HCC) (Primary) Sugar slightly improved since stopping metformin  (due to weight loss and upset stomach) and starting glipizide , pioglitazone , on Onglyza.   - CMP14+EGFR - C-peptide  Will increase glipizide  if c-peptide is normal. Otherwise she may  need insulin.   2. Abnormal weight loss Possibly secondary to uncontrolled blood sugars.   Need additional labs  - Lipase - TSH - CBC with Differential/Platelet     Nancyann Perry, MD  [facility] [phone] (phone) [phone] (fax)  [facility]

## 2024-02-28 ENCOUNTER — Other Ambulatory Visit: Payer: Self-pay

## 2024-02-28 ENCOUNTER — Telehealth: Payer: Self-pay | Admitting: Family Medicine

## 2024-02-28 DIAGNOSIS — E119 Type 2 diabetes mellitus without complications: Secondary | ICD-10-CM

## 2024-02-28 MED ORDER — PIOGLITAZONE HCL 30 MG PO TABS: ORAL_TABLET | ORAL | 0 refills | Status: DC

## 2024-02-28 NOTE — Telephone Encounter (Signed)
[facility] faxed refill request for the following medications:  pioglitazone (ACTOS) 30 MG tablet    Please advise.  

## 2024-02-28 NOTE — Telephone Encounter (Signed)
 Converted into a refill request

## 2024-02-29 ENCOUNTER — Ambulatory Visit: Payer: Self-pay | Admitting: Family Medicine

## 2024-02-29 DIAGNOSIS — E119 Type 2 diabetes mellitus without complications: Secondary | ICD-10-CM

## 2024-02-29 LAB — CBC WITH DIFFERENTIAL/PLATELET
Basophils Absolute: 0 x10E3/uL (ref 0.0–0.2)
Basos: 1 %
EOS (ABSOLUTE): 0 x10E3/uL (ref 0.0–0.4)
Eos: 0 %
Hematocrit: 47.2 % — ABNORMAL HIGH (ref 34.0–46.6)
Hemoglobin: 14.5 g/dL (ref 11.1–15.9)
Immature Grans (Abs): 0 x10E3/uL (ref 0.0–0.1)
Immature Granulocytes: 0 %
Lymphocytes Absolute: 1.6 x10E3/uL (ref 0.7–3.1)
Lymphs: 28 %
MCH: 27.6 pg (ref 26.6–33.0)
MCHC: 30.7 g/dL — ABNORMAL LOW (ref 31.5–35.7)
MCV: 90 fL (ref 79–97)
Monocytes Absolute: 0.4 x10E3/uL (ref 0.1–0.9)
Monocytes: 7 %
Neutrophils Absolute: 3.7 x10E3/uL (ref 1.4–7.0)
Neutrophils: 64 %
Platelets: 138 x10E3/uL — ABNORMAL LOW (ref 150–450)
RBC: 5.25 x10E6/uL (ref 3.77–5.28)
RDW: 11.8 % (ref 11.7–15.4)
WBC: 5.8 x10E3/uL (ref 3.4–10.8)

## 2024-02-29 LAB — CMP14+EGFR
ALT: 19 IU/L (ref 0–32)
AST: 19 IU/L (ref 0–40)
Albumin: 4.8 g/dL (ref 3.9–4.9)
Alkaline Phosphatase: 166 IU/L — ABNORMAL HIGH (ref 49–135)
BUN/Creatinine Ratio: 18 (ref 12–28)
BUN: 15 mg/dL (ref 8–27)
Bilirubin Total: 0.7 mg/dL (ref 0.0–1.2)
CO2: 24 mmol/L (ref 20–29)
Calcium: 10.3 mg/dL (ref 8.7–10.3)
Chloride: 103 mmol/L (ref 96–106)
Creatinine, Ser: 0.84 mg/dL (ref 0.57–1.00)
Globulin, Total: 2.5 g/dL (ref 1.5–4.5)
Glucose: 348 mg/dL — ABNORMAL HIGH (ref 70–99)
Potassium: 5 mmol/L (ref 3.5–5.2)
Sodium: 142 mmol/L (ref 134–144)
Total Protein: 7.3 g/dL (ref 6.0–8.5)
eGFR: 77 mL/min/1.73 (ref >59)

## 2024-02-29 LAB — C-PEPTIDE: C-Peptide: 3.1 ng/mL (ref 1.1–4.4)

## 2024-02-29 LAB — TSH: TSH: 0.877 u[IU]/mL (ref 0.450–4.500)

## 2024-02-29 LAB — LIPASE: Lipase: 43 U/L (ref 14–72)

## 2024-02-29 MED ORDER — GLIPIZIDE 5 MG PO TABS: 5.0000 mg | ORAL_TABLET | Freq: Every day | ORAL | Status: DC

## 2024-02-29 MED ORDER — LANTUS SOLOSTAR 100 UNIT/ML ~~LOC~~ SOPN: 10.0000 [IU] | PEN_INJECTOR | Freq: Every evening | SUBCUTANEOUS | 99 refills | Status: DC

## 2024-03-02 NOTE — Addendum Note (Signed)
 Addended by: MARYLEN ODELLA CROME on: 03/02/2024 04:04 PM   Modules accepted: Orders

## 2024-03-05 NOTE — Progress Notes (Signed)
 I attempted; Designess 2: Calleen Borer Phone Number:[phone] Relationship:son. No answer when called, lvm to return call.

## 2024-03-06 NOTE — Progress Notes (Signed)
 Letter will be mailed, as 3 attempts has been made 4 with pt DPR. No answers when called.

## 2024-03-25 ENCOUNTER — Other Ambulatory Visit: Payer: Self-pay | Admitting: Family Medicine

## 2024-03-25 DIAGNOSIS — E119 Type 2 diabetes mellitus without complications: Secondary | ICD-10-CM

## 2024-03-28 ENCOUNTER — Other Ambulatory Visit: Payer: Self-pay | Admitting: Family Medicine

## 2024-03-28 DIAGNOSIS — E119 Type 2 diabetes mellitus without complications: Secondary | ICD-10-CM

## 2024-04-17 ENCOUNTER — Other Ambulatory Visit: Payer: Self-pay | Admitting: Family Medicine

## 2024-04-17 DIAGNOSIS — E119 Type 2 diabetes mellitus without complications: Secondary | ICD-10-CM

## 2024-04-17 NOTE — Telephone Encounter (Signed)
 Copied from CRM [id number]. Topic: Clinical - Medication Refill >> Apr 17, 2024  1:37 PM Hadassah PARAS wrote: Medication: glipiZIDE  (GLUCOTROL ) 5 MG tablet   Has the patient contacted their pharmacy? No (Agent: If no, request that the patient contact the pharmacy for the refill. If patient does not wish to contact the pharmacy document the reason why and proceed with request.) (Agent: If yes, when and what did the pharmacy advise?)  This is the patient's preferred pharmacy:  [id number] [place], [place] - [address] AT [facility] OF [address] [place] [address] Phone: [phone] Fax: [phone]  Is this the correct pharmacy for this prescription? Yes If no, delete pharmacy and type the correct one.   Has the prescription been filled recently? Yes  Is the patient out of the medication? No  Has the patient been seen for an appointment in the last year OR does the patient have an upcoming appointment? Yes  Can we respond through MyChart? No, phone call [phone]   Agent: Please be advised that Rx refills may take up to 3 business days. We ask that you follow-up with your pharmacy.

## 2024-04-19 NOTE — Telephone Encounter (Signed)
 Requested medication (s) are due for refill today: yes  Requested medication (s) are on the active medication list: yes  Last refill:  03/28/24  Future visit scheduled: no  Notes to clinic:  routing for review of SIG     Requested Prescriptions  Pending Prescriptions Disp Refills   glipiZIDE  (GLUCOTROL ) 5 MG tablet 30 tablet 0     Endocrinology:  Diabetes - Sulfonylureas Failed - 04/19/2024  1:22 PM      Failed - HBA1C is between 0 and 7.9 and within 180 days    Hemoglobin A1C  Date Value Ref Range Status  12/19/2023 14.8 (A) 4.0 - 5.6 % Final   Hgb A1c MFr Bld  Date Value Ref Range Status  02/01/2023 7.8 (H) 4.8 - 5.6 % Final    Comment:             Prediabetes: 5.7 - 6.4          Diabetes: >6.4          Glycemic control for adults with diabetes: <7.0          Passed - Cr in normal range and within 360 days    Creatinine, Ser  Date Value Ref Range Status  02/27/2024 0.84 0.57 - 1.00 mg/dL Final         Passed - Valid encounter within last 6 months    Recent Outpatient Visits           1 month ago Diabetes mellitus without complication [facility])   [place] [facility] Gasper Nancyann BRAVO, MD   4 months ago Abnormal weight loss   [facility] Gasper Nancyann BRAVO, MD   5 months ago Diabetes mellitus without complication [facility])   [place] [facility] Gasper, Nancyann BRAVO, MD

## 2024-04-20 NOTE — Telephone Encounter (Signed)
 Pt called back into the office and stated that she only has 2 pills left, which will make it throught the weekend but then she will be without. Please call pt and advise of refill.

## 2024-04-25 MED ORDER — GLIPIZIDE 5 MG PO TABS: 5.0000 mg | ORAL_TABLET | Freq: Every day | ORAL | 0 refills | Status: DC

## 2024-04-29 ENCOUNTER — Other Ambulatory Visit: Payer: Self-pay | Admitting: Family Medicine

## 2024-04-29 DIAGNOSIS — E119 Type 2 diabetes mellitus without complications: Secondary | ICD-10-CM

## 2024-04-30 ENCOUNTER — Ambulatory Visit: Admitting: Family Medicine

## 2024-04-30 ENCOUNTER — Telehealth: Payer: Self-pay | Admitting: Family Medicine

## 2024-04-30 ENCOUNTER — Other Ambulatory Visit: Payer: Self-pay

## 2024-04-30 ENCOUNTER — Encounter: Payer: Self-pay | Admitting: Family Medicine

## 2024-04-30 VITALS — BP 135/77 | HR 69 | Wt 111.7 lb

## 2024-04-30 DIAGNOSIS — E119 Type 2 diabetes mellitus without complications: Secondary | ICD-10-CM

## 2024-04-30 LAB — POCT GLYCOSYLATED HEMOGLOBIN (HGB A1C)
Est. average glucose Bld gHb Est-mCnc: 280
Hemoglobin A1C: 11.4 % — AB (ref 4.0–5.6)

## 2024-04-30 MED ORDER — GLIPIZIDE 5 MG PO TABS: 5.0000 mg | ORAL_TABLET | Freq: Two times a day (BID) | ORAL | Status: DC

## 2024-04-30 MED ORDER — ONETOUCH ULTRASOFT LANCETS MISC: 12 refills | Status: AC

## 2024-04-30 MED ORDER — LANTUS SOLOSTAR 100 UNIT/ML ~~LOC~~ SOPN: 18.0000 [IU] | PEN_INJECTOR | Freq: Every evening | SUBCUTANEOUS | 5 refills | Status: DC

## 2024-04-30 NOTE — Telephone Encounter (Signed)
 [facility] faxed refill request for the following medications:  saxagliptin  HCl (ONGLYZA) 5 MG TABS tablet     Please advise.

## 2024-04-30 NOTE — Progress Notes (Signed)
 Established patient visit   Patient: Kayla Allen   DOB: Feb 26, 1956   68 y.o. Female  MRN: [id number] Visit Date: 04/30/2024  Today's healthcare provider: Nancyann Perry, MD   Chief Complaint  Patient presents with   Medical Management of Chronic Issues    DM   Subjective    Discussed the use of AI scribe software for clinical note transcription with the patient, who gave verbal consent to proceed.  History of Present Illness   Kayla Allen is a 68 year old female with diabetes who presents for management of her blood sugar levels.  She has been experiencing difficulty in managing her blood sugar levels, with readings ranging from 190 to 220 mg/dL. Her lowest reading was 190 mg/dL. She is currently taking glipizide  twice daily and insulin (Lantus ) at a dose of 10 units at night due to her work schedule. Her A1c level was previously 14% and has now decreased to 11%.  She has been experimenting with her glipizide  dosage, taking two tablets in the morning and one at night, which she believes helped achieve a blood sugar level of 190 mg/dL. She works 12-hour shifts and has adjusted her medication schedule accordingly.  She has made several dietary changes, including reducing coffee intake and using non-sugar creamer, drinking zero-sugar drinks, and trying to drink at least one bottle of water daily. She bakes her chicken and avoids fried foods, and she is incorporating fruits like bananas into her diet. She consumes a banana and peanut butter sandwich in the morning to take her vitamins and glipizide .  In addition to glipizide , she takes pioglitazone . She notes experiencing increased hot flashes. She has observed weight changes, noting a previous weight gain of two pounds but is currently losing weight.     Lab Results  Component Value Date   HGBA1C 11.4 (A) 04/30/2024   HGBA1C 14.8 (A) 12/19/2023   HGBA1C 15.0 (A) 11/16/2023   Lab Results  Component Value Date   NA  142 02/27/2024   K 5.0 02/27/2024   CREATININE 0.84 02/27/2024   EGFR 77 02/27/2024   GLUCOSE 348 (H) 02/27/2024   Wt Readings from Last 3 Encounters:  04/30/24 111 lb 11.2 oz (50.7 kg)  02/27/24 107 lb (48.5 kg)  12/19/23 108 lb (49 kg)     Medications: Outpatient Medications Prior to Visit  Medication Sig   BLACK COHOSH EXTRACT PO Take 1 tablet by mouth daily.   Blood Glucose Monitoring Suppl (ONE TOUCH ULTRA 2) w/Device KIT Use to check sugar daily for type 2 diabetes E11.9   glucose blood (ONETOUCH ULTRA) test strip Use to check blood sugar daily for type 2 diabetes E11.9   Multiple Vitamins-Minerals (MULTIVITAMIN WITH MINERALS) tablet Take 1 tablet by mouth daily.   pioglitazone  (ACTOS ) 30 MG tablet TAKE 1 TABLET(30 MG) BY MOUTH DAILY    glipiZIDE  (GLUCOTROL ) 5 MG tablet Take 1 tablet (5 mg total) by mouth twice daily before breakfast.   insulin glargine  (LANTUS  SOLOSTAR) 100 UNIT/ML Solostar Pen Inject 10 Units into the skin every evening.   saxagliptin  HCl (ONGLYZA) 5 MG TABS tablet Take 1 tablet (5 mg total) by mouth daily. (Patient not taking: Reported on 04/30/2024)   No facility-administered medications prior to visit.       Objective    BP 135/77 (BP Location: Left Arm, Patient Position: Sitting, Cuff Size: Normal)   Pulse 69   Wt 111 lb 11.2 oz (50.7 kg)   SpO2 100%  BMI 18.03 kg/m   Physical Exam   General appearance: Thin female, cooperative and in no acute distress Head: Normocephalic, without obvious abnormality, atraumatic Respiratory: Respirations even and unlabored, normal respiratory rate Extremities: All extremities are intact.  Skin: Skin color, texture, turgor normal. No rashes seen  Psych: Appropriate mood and affect. Neurologic: Mental status: Alert, oriented to person, place, and time, thought content appropriate.   Results for orders placed or performed in visit on 04/30/24  POCT glycosylated hemoglobin (Hb A1C)  Result Value Ref Range    Hemoglobin A1C 11.4 (A) 4.0 - 5.6 %   Est. average glucose Bld gHb Est-mCnc 280     Assessment & Plan       1. Diabetes mellitus without complication (HCC) (Primary) Significantly improved sine adding 10 units insulin glargine . Will increase to  insulin glargine  (LANTUS  SOLOSTAR) 100 UNIT/ML Solostar Pen; Inject 18 Units into the skin every evening.  Dispense: 15 mL; Refill: 5  continue glipiZIDE  (GLUCOTROL ) 5 MG tablet; Take 1 tablet (5 mg total) by mouth 2 (two) times daily before a meal.  refill - Lancets (ONETOUCH ULTRASOFT) lancets; Use as instructed  Dispense: 100 each; Refill: 12  Future Appointments  Date Time Provider Department Center  06/15/2024  1:20 PM Marice Angelino, Nancyann BRAVO, MD [facility] Michaela Nancyann Perry, MD  [facility] [phone] (phone) [phone] (fax)  [facility]

## 2024-04-30 NOTE — Telephone Encounter (Signed)
 error

## 2024-04-30 NOTE — Patient Instructions (Signed)
 Kayla Allen  Please review the attached list of medications and notify my office if there are any errors.   . Please bring all of your medications to every appointment so we can make sure that our medication list is the same as yours.

## 2024-04-30 NOTE — Telephone Encounter (Signed)
 Duplicate request

## 2024-05-03 ENCOUNTER — Other Ambulatory Visit: Payer: Self-pay | Admitting: Family Medicine

## 2024-05-03 DIAGNOSIS — E119 Type 2 diabetes mellitus without complications: Secondary | ICD-10-CM

## 2024-05-18 ENCOUNTER — Other Ambulatory Visit: Payer: Self-pay | Admitting: Family Medicine

## 2024-05-18 DIAGNOSIS — E119 Type 2 diabetes mellitus without complications: Secondary | ICD-10-CM

## 2024-05-23 ENCOUNTER — Telehealth: Payer: Self-pay | Admitting: Pharmacy Technician

## 2024-05-23 ENCOUNTER — Other Ambulatory Visit (HOSPITAL_COMMUNITY): Payer: Self-pay

## 2024-05-23 NOTE — Telephone Encounter (Signed)
 Pharmacy Patient Advocate Encounter   Received notification from Onbase that prior authorization for Lantus  SoloStar 100UNIT/ML pen-injectors is required/requested.   Insurance verification completed.   The patient is insured through [facility].   Per test claim: Refill too soon. PA is not needed at this time. Medication was filled 05/19/2024. Next eligible fill date is 05/31/2024.

## 2024-05-29 ENCOUNTER — Other Ambulatory Visit: Payer: Self-pay | Admitting: Family Medicine

## 2024-05-29 DIAGNOSIS — E119 Type 2 diabetes mellitus without complications: Secondary | ICD-10-CM

## 2024-05-29 MED ORDER — PIOGLITAZONE HCL 30 MG PO TABS: ORAL_TABLET | ORAL | 1 refills | Status: AC

## 2024-05-29 NOTE — Telephone Encounter (Unsigned)
 Copied from CRM #[id number]. Topic: Clinical - Medication Refill >> May 29, 2024 10:55 AM Victoria B wrote: Medication: pioglitazone  (ACTOS ) 30 MG tablet  Has the patient contacted their pharmacy? no  This is the patient's preferred pharmacy:  [facility] #[id number] [place], [place] - [address] AT [facility] [address] [place] [address] Phone: [phone] Fax: [phone]  Is this the correct pharmacy for this prescription? yes  Has the prescription been filled recently? no  Is the patient out of the medication? yes  Has the patient been seen for an appointment in the last year OR does the patient have an upcoming appointment? yes  Can we respond through MyChart? no  Agent: Please be advised that Rx refills may take up to 3 business days. We ask that you follow-up with your pharmacy.

## 2024-05-29 NOTE — Telephone Encounter (Unsigned)
 Copied from CRM [id number]. Topic: General - Other >> May 29, 2024 10:57 AM Kayla Allen wrote: Reason for CRM: Patient wants to be set up on automatic refills

## 2024-06-08 ENCOUNTER — Other Ambulatory Visit: Payer: Self-pay | Admitting: Family Medicine

## 2024-06-08 DIAGNOSIS — E119 Type 2 diabetes mellitus without complications: Secondary | ICD-10-CM

## 2024-06-08 NOTE — Telephone Encounter (Signed)
 Requested Prescriptions  Refused Prescriptions Disp Refills   saxagliptin  HCl (ONGLYZA) 5 MG TABS tablet 90 tablet 2     Endocrinology:  Diabetes - DPP-4 Inhibitors Failed - 06/08/2024  5:21 PM      Failed - HBA1C is between 0 and 7.9 and within 180 days    Hemoglobin A1C  Date Value Ref Range Status  04/30/2024 11.4 (A) 4.0 - 5.6 % Final   Hgb A1c MFr Bld  Date Value Ref Range Status  02/01/2023 7.8 (H) 4.8 - 5.6 % Final    Comment:             Prediabetes: 5.7 - 6.4          Diabetes: >6.4          Glycemic control for adults with diabetes: <7.0          Passed - Cr in normal range and within 360 days    Creatinine, Ser  Date Value Ref Range Status  02/27/2024 0.84 0.57 - 1.00 mg/dL Final         Passed - Valid encounter within last 6 months    Recent Outpatient Visits           1 month ago Diabetes mellitus without complication (HCC)   [place] [facility] Gasper Nancyann BRAVO, MD   3 months ago Diabetes mellitus without complication [facility])   [place] [facility] Gasper Nancyann BRAVO, MD   5 months ago Abnormal weight loss   [facility] Gasper Nancyann BRAVO, MD   6 months ago Diabetes mellitus without complication [facility])   [place] [facility] Gasper Nancyann BRAVO, MD               glipiZIDE  (GLUCOTROL ) 5 MG tablet 30 tablet 1     Endocrinology:  Diabetes - Sulfonylureas Failed - 06/08/2024  5:21 PM      Failed - HBA1C is between 0 and 7.9 and within 180 days    Hemoglobin A1C  Date Value Ref Range Status  04/30/2024 11.4 (A) 4.0 - 5.6 % Final   Hgb A1c MFr Bld  Date Value Ref Range Status  02/01/2023 7.8 (H) 4.8 - 5.6 % Final    Comment:             Prediabetes: 5.7 - 6.4          Diabetes: >6.4          Glycemic control for adults with diabetes: <7.0          Passed - Cr in normal range and within 360 days    Creatinine, Ser  Date Value Ref Range Status  02/27/2024 0.84 0.57 - 1.00  mg/dL Final         Passed - Valid encounter within last 6 months    Recent Outpatient Visits           1 month ago Diabetes mellitus without complication [facility])   [place] [facility] Gasper Nancyann BRAVO, MD   3 months ago Diabetes mellitus without complication [facility])   [place] [facility] Gasper Nancyann BRAVO, MD   5 months ago Abnormal weight loss   [facility] Gasper Nancyann BRAVO, MD   6 months ago Diabetes mellitus without complication [facility])   [place] [facility] Gasper, Nancyann BRAVO, MD

## 2024-06-08 NOTE — Telephone Encounter (Signed)
 Copied from CRM [id number]. Topic: Clinical - Medication Refill >> Jun 08, 2024 12:34 PM Joesph B wrote: Medication:  saxagliptin  HCl (ONGLYZA) 5 MG TABS tablet  glipiZIDE  (GLUCOTROL ) 5 MG tablet   Has the patient contacted their pharmacy? Yes (Agent: If no, request that the patient contact the pharmacy for the refill. If patient does not wish to contact the pharmacy document the reason why and proceed with request.) (Agent: If yes, when and what did the pharmacy advise?)  This is the patient's preferred pharmacy:  [id number] [place], [place] - [address] AT [facility] OF [address] [place] [address] Phone: [phone] Fax: [phone]  Is this the correct pharmacy for this prescription? Yes If no, delete pharmacy and type the correct one.   Has the prescription been filled recently? Yes  Is the patient out of the medication? Yes  Has the patient been seen for an appointment in the last year OR does the patient have an upcoming appointment? Yes  Can we respond through MyChart? Yes  Agent: Please be advised that Rx refills may take up to 3 business days. We ask that you follow-up with your pharmacy.

## 2024-06-10 MED ORDER — GLIPIZIDE 5 MG PO TABS: 5.0000 mg | ORAL_TABLET | Freq: Every day | ORAL | 1 refills | Status: DC

## 2024-06-10 MED ORDER — SAXAGLIPTIN HCL 5 MG PO TABS: 5.0000 mg | ORAL_TABLET | Freq: Every day | ORAL | 0 refills | Status: DC

## 2024-06-10 NOTE — Addendum Note (Signed)
 Addended by: GASPER NANCYANN BRAVO on: 06/10/2024 06:58 PM   Modules accepted: Orders

## 2024-06-12 NOTE — Telephone Encounter (Unsigned)
 Copied from CRM [id number]. Topic: Clinical - Prescription Issue >> Jun 12, 2024 12:51 PM Selinda RAMAN wrote: Reason for CRM: The patient states she sees on latest prescription refill for her saxagliptin  HCl (ONGLYZA) 5 MG TABS tablet 90 tablet 0 06/10/2024 -  Sig - Route: Take 1 tablet (5 mg total) by mouth daily. -   That it says 1 a day but she says she has spoke with her provider recently about taking 2 a day as that is what has really helped. That being said if her provider agrees can a new #90 day prescription with the new directions be called into her pharmacy:  [facility] #[id number] [place], [place] - [address] AT [facility] [address]  Phone: [phone] Fax: [phone]  Please assist patient further

## 2024-06-15 ENCOUNTER — Ambulatory Visit: Admitting: Family Medicine

## 2024-06-15 ENCOUNTER — Encounter: Payer: Self-pay | Admitting: Family Medicine

## 2024-06-15 VITALS — BP 126/85 | HR 76 | Resp 14 | Ht 66.0 in | Wt 112.2 lb

## 2024-06-15 DIAGNOSIS — Z7984 Long term (current) use of oral hypoglycemic drugs: Secondary | ICD-10-CM

## 2024-06-15 DIAGNOSIS — Z1211 Encounter for screening for malignant neoplasm of colon: Secondary | ICD-10-CM

## 2024-06-15 DIAGNOSIS — Z122 Encounter for screening for malignant neoplasm of respiratory organs: Secondary | ICD-10-CM | POA: Diagnosis not present

## 2024-06-15 DIAGNOSIS — D6949 Other primary thrombocytopenia: Secondary | ICD-10-CM

## 2024-06-15 DIAGNOSIS — E2839 Other primary ovarian failure: Secondary | ICD-10-CM | POA: Diagnosis not present

## 2024-06-15 DIAGNOSIS — Z1231 Encounter for screening mammogram for malignant neoplasm of breast: Secondary | ICD-10-CM

## 2024-06-15 DIAGNOSIS — F17210 Nicotine dependence, cigarettes, uncomplicated: Secondary | ICD-10-CM

## 2024-06-15 DIAGNOSIS — E119 Type 2 diabetes mellitus without complications: Secondary | ICD-10-CM | POA: Diagnosis not present

## 2024-06-15 DIAGNOSIS — Z23 Encounter for immunization: Secondary | ICD-10-CM | POA: Diagnosis not present

## 2024-06-15 MED ORDER — LANTUS SOLOSTAR 100 UNIT/ML ~~LOC~~ SOPN: 14.0000 [IU] | PEN_INJECTOR | Freq: Every evening | SUBCUTANEOUS | Status: AC

## 2024-06-15 MED ORDER — SAXAGLIPTIN HCL 5 MG PO TABS: 5.0000 mg | ORAL_TABLET | Freq: Every day | ORAL | 0 refills | Status: AC

## 2024-06-15 NOTE — Patient Instructions (Addendum)
 Please review the attached list of medications and notify my office if there are any errors.   Please call the [facility] [phone]) to schedule a routine screening mammogram.

## 2024-06-15 NOTE — Progress Notes (Addendum)
 "     Established patient visit   Patient: Kayla Allen   DOB: 1956-02-04   69 y.o. Female  MRN: [id number] Visit Date: 06/15/2024  Today's healthcare provider: Nancyann Perry, MD   Chief Complaint  Patient presents with   Medical Management of Chronic Issues    6 week f/u   Subjective    Discussed the use of AI scribe software for clinical note transcription with the patient, who gave verbal consent to proceed.  History of Present Illness   Kayla Allen is a 69 year old female with diabetes who presents with issues related to medication management and blood sugar control.  She has been experiencing difficulties with her pharmacy in obtaining her saxagliptin  prescription, which was sent on January 4th. She has been unable to pick it up and has missed approximately a week's worth of doses, leaving her with only one pill remaining. She is frustrated with the pharmacy's handling of her prescriptions.  She is currently taking glipizide  5 mg twice daily, once in the morning before 7 AM and once during her lunch break around 2 or 3 PM. Despite this regimen, her blood sugar levels remain elevated, typically around 270-280 mg/dL, and spike to 689-684 mg/dL when she misses saxagliptin . She has been adjusting her insulin dosage, initially trying 18 units but found it too much for her body, causing discomfort. She has since reduced it to 12 units, which she feels is more manageable. Her morning blood sugar readings are still high, around 280 mg/dL.  She confirms she is still taking pioglitazone  30 mg and inquires about the safety of taking it with her other medications. She mentions a concern about weight gain, noting that her weight has leveled off but has not increased as expected.  Her last hemoglobin A1c was 11.7%, improved from a previous reading of 16-17%. She has completed a colon screening test in the past year, although there were issues with sample contamination.  She has pain in  her back and hips.     Lab Results  Component Value Date   HGBA1C 11.4 (A) 04/30/2024   HGBA1C 14.8 (A) 12/19/2023   HGBA1C 15.0 (A) 11/16/2023   Lab Results  Component Value Date   NA 142 02/27/2024   CL 103 02/27/2024   K 5.0 02/27/2024   CO2 24 02/27/2024   BUN 15 02/27/2024   CREATININE 0.84 02/27/2024   EGFR 77 02/27/2024   CALCIUM 10.3 02/27/2024   PHOS 3.5 12/19/2023   ALBUMIN 4.8 02/27/2024   GLUCOSE 348 (H) 02/27/2024   Wt Readings from Last 3 Encounters:  06/15/24 112 lb 3.2 oz (50.9 kg)  04/30/24 111 lb 11.2 oz (50.7 kg)  02/27/24 107 lb (48.5 kg)     Medications: Show/hide medication list[1] Review of Systems  Constitutional:  Negative for appetite change, chills, fatigue and fever.  Respiratory:  Negative for chest tightness and shortness of breath.   Cardiovascular:  Negative for chest pain and palpitations.  Gastrointestinal:  Negative for abdominal pain, nausea and vomiting.  Neurological:  Negative for dizziness and weakness.       Objective    BP 126/85   Pulse 76   Resp 14   Ht 5' 6 (1.676 m)   Wt 112 lb 3.2 oz (50.9 kg)   SpO2 100%   BMI 18.11 kg/m   Physical Exam   General appearance: Thin female, cooperative and in no acute distress Head: Normocephalic, without obvious abnormality, atraumatic Respiratory: Respirations even  and unlabored, normal respiratory rate Extremities: All extremities are intact.  Skin: Skin color, texture, turgor normal. No rashes seen  Psych: Appropriate mood and affect. Neurologic: Mental status: Alert, oriented to person, place, and time, thought content appropriate.    Assessment & Plan       Type 2 diabetes mellitus Suboptimal glycemic control with elevated blood glucose levels. Saxagliptin  prescription issues and insulin-related weight gain noted. - Increased insulin dose to 14 units. - Contacted pharmacy to resolve saxagliptin  prescription issue. - Continue glipizide  twice daily. - Continue  pioglitazone  30 mg daily. - Provided Cologuard kit for colon cancer screening.  Essential hypertension Well controlled.  Continue current medications.    General Health Maintenance Due for pneumonia vaccination and colon cancer screening. - Administered pneumonia vaccine. - Provided iFOBT kit for colon cancer screening.   Colon cancer screening Given iFOBT collection kit.   Encounter for screening mammogram for malignant neoplasm of breast  - MM 3D SCREENING MAMMOGRAM BILATERAL BREAST; Future   Smoking greater than 30 pack years  - Ambulatory Referral for Lung Cancer Screening [REF832]  Estrogen deficiency  - DG Bone Density; Future  Need for pneumococcal vaccination  - Pneumococcal conjugate vaccine 20-valent (Prevnar 20)     Return in about 6 weeks (around 07/27/2024).      Nancyann Perry, MD  [facility] [phone] (phone) [phone] (fax)  [facility]     [1]  Outpatient Medications Prior to Visit  Medication Sig   glipiZIDE  (GLUCOTROL ) 5 MG tablet Take 1 tablet (5 mg total) by mouth daily before breakfast. (Patient taking differently: Take 5 mg by mouth in the morning and at bedtime.)   [DISCONTINUED] insulin glargine  (LANTUS  SOLOSTAR) 100 UNIT/ML Solostar Pen Inject 18 Units into the skin every evening. (Patient taking differently: Inject 12 Units into the skin every evening.)   BLACK COHOSH EXTRACT PO Take 1 tablet by mouth daily.   Blood Glucose Monitoring Suppl (ONE TOUCH ULTRA 2) w/Device KIT Use to check sugar daily for type 2 diabetes E11.9   glucose blood (ONETOUCH ULTRA) test strip Use to check blood sugar daily for type 2 diabetes E11.9   Lancets (ONETOUCH ULTRASOFT) lancets Use as instructed   Multiple Vitamins-Minerals (MULTIVITAMIN WITH MINERALS) tablet Take 1 tablet by mouth daily.   pioglitazone  (ACTOS ) 30 MG tablet TAKE 1 TABLET(30 MG) BY MOUTH DAILY   [DISCONTINUED] saxagliptin  HCl (ONGLYZA) 5 MG  TABS tablet Take 1 tablet (5 mg total) by mouth daily.   No facility-administered medications prior to visit.   "

## 2024-07-11 ENCOUNTER — Other Ambulatory Visit: Payer: Self-pay | Admitting: Family Medicine

## 2024-07-11 DIAGNOSIS — E119 Type 2 diabetes mellitus without complications: Secondary | ICD-10-CM

## 2024-07-11 NOTE — Telephone Encounter (Signed)
 Copied from CRM [id number]. Topic: Clinical - Medication Refill >> Jul 11, 2024  1:55 PM Hadassah PARAS wrote: Medication: glipiZIDE  (GLUCOTROL ) 5 MG tablet; pioglitazone  (ACTOS ) 30 MG tablet    Has the patient contacted their pharmacy? No (Agent: If no, request that the patient contact the pharmacy for the refill. If patient does not wish to contact the pharmacy document the reason why and proceed with request.) (Agent: If yes, when and what did the pharmacy advise?)  This is the patient's preferred pharmacy:  [facility] #[id number] [place], [place] - [address] AT [facility] OF [address] [place] [address] Phone: [phone] Fax: [phone]  Is this the correct pharmacy for this prescription? Yes If no, delete pharmacy and type the correct one.   Has the prescription been filled recently? Yes  Is the patient out of the medication? Yes  Has the patient been seen for an appointment in the last year OR does the patient have an upcoming appointment? Yes  Can we respond through MyChart? Yes  Agent: Please be advised that Rx refills may take up to 3 business days. We ask that you follow-up with your pharmacy.

## 2024-07-13 MED ORDER — GLIPIZIDE 5 MG PO TABS: 5.0000 mg | ORAL_TABLET | Freq: Two times a day (BID) | ORAL | 1 refills | Status: AC

## 2024-07-13 NOTE — Telephone Encounter (Signed)
 Requested medications are due for refill today.  Unsure - pt is taking medication 2 times daily - prescription states pt is to take once daily  Requested medications are on the active medications list.  yes  Last refill. 06/10/2024 #30 1 rf  Future visit scheduled.   yes  Notes to clinic.   Patient taking differently:   Take 5 mg by mouth in the morning and at bedtime.    Route:   Oral     Please advise.   Requested Prescriptions  Pending Prescriptions Disp Refills   glipiZIDE  (GLUCOTROL ) 5 MG tablet 30 tablet 1    Sig: Take 1 tablet (5 mg total) by mouth daily before breakfast.     Endocrinology:  Diabetes - Sulfonylureas Failed - 07/13/2024 11:15 AM      Failed - HBA1C is between 0 and 7.9 and within 180 days    Hemoglobin A1C  Date Value Ref Range Status  04/30/2024 11.4 (A) 4.0 - 5.6 % Final   Hgb A1c MFr Bld  Date Value Ref Range Status  02/01/2023 7.8 (H) 4.8 - 5.6 % Final    Comment:             Prediabetes: 5.7 - 6.4          Diabetes: >6.4          Glycemic control for adults with diabetes: <7.0          Passed - Cr in normal range and within 360 days    Creatinine, Ser  Date Value Ref Range Status  02/27/2024 0.84 0.57 - 1.00 mg/dL Final         Passed - Valid encounter within last 6 months    Recent Outpatient Visits           4 weeks ago Diabetes mellitus without complication (HCC)   [place] [facility] Gasper Nancyann BRAVO, MD   2 months ago Diabetes mellitus without complication [facility])   [place] [facility] Gas[facility] Nancyann BRAVO, MD   4 months ago Diabetes mellitus without complication [facility])   [place] [facility] [place] Nancyann BRAVO, MD   6 months ago Abnormal weight loss   [facility] Nancyann BRAVO, MD   8 months ago Diabetes mellitus without complication [facility])    [facility] Gas[facility] Nancyann BRAVO, MD              Refused Prescriptions Disp Refills    pioglitazone  (ACTOS ) 30 MG tablet 90 tablet 1    Sig: TAKE 1 TABLET(30 MG) BY MOUTH DAILY     Endocrinology:  Diabetes - Glitazones - pioglitazone  Failed - 07/13/2024 11:15 AM      Failed - HBA1C is between 0 and 7.9 and within 180 days    Hemoglobin A1C  Date Value Ref Range Status  04/30/2024 11.4 (A) 4.0 - 5.6 % Final   Hgb A1c MFr Bld  Date Value Ref Range Status  02/01/2023 7.8 (H) 4.8 - 5.6 % Final    Comment:             Prediabetes: 5.7 - 6.4          Diabetes: >6.4          Glycemic control for adults with diabetes: <7.0          Passed - Valid encounter within last 6 months    Recent Outpatient Visits  4 weeks ago Diabetes mellitus without complication [facility])   [place] [facility] Gasper Nancyann BRAVO, MD   2 months ago Diabetes mellitus without complication [facility])   [place] [facility] [place] Nancyann BRAVO, MD   4 months ago Diabetes mellitus without complication [facility])   [place] [facility] Gas[place] Nancyann BRAVO, MD   6 months ago Abnormal weight loss   [facility] Gasper Nancyann BRAVO, MD   8 months ago Diabetes mellitus without complication [facility])   [place] [facility] Gasper Nancyann BRAVO, MD

## 2024-07-13 NOTE — Telephone Encounter (Signed)
 Requested Prescriptions  Pending Prescriptions Disp Refills   glipiZIDE  (GLUCOTROL ) 5 MG tablet 30 tablet 1    Sig: Take 1 tablet (5 mg total) by mouth daily before breakfast.     Endocrinology:  Diabetes - Sulfonylureas Failed - 07/13/2024 11:15 AM      Failed - HBA1C is between 0 and 7.9 and within 180 days    Hemoglobin A1C  Date Value Ref Range Status  04/30/2024 11.4 (A) 4.0 - 5.6 % Final   Hgb A1c MFr Bld  Date Value Ref Range Status  02/01/2023 7.8 (H) 4.8 - 5.6 % Final    Comment:             Prediabetes: 5.7 - 6.4          Diabetes: >6.4          Glycemic control for adults with diabetes: <7.0          Passed - Cr in normal range and within 360 days    Creatinine, Ser  Date Value Ref Range Status  02/27/2024 0.84 0.57 - 1.00 mg/dL Final         Passed - Valid encounter within last 6 months    Recent Outpatient Visits           4 weeks ago Diabetes mellitus without complication (HCC)   [place] [facility] Gas[facility] Nancyann BRAVO, MD   2 months ago Diabetes mellitus without complication [facility])   [facility] Gasper Nancyann BRAVO, MD   4 months ago Diabetes mellitus without complication [facility])   [place] [facility] [place] Gasper Nancyann BRAVO, MD   6 months ago Abnormal weight loss   [facility] Gasper Nancyann BRAVO, MD   8 months ago Diabetes mellitus without complication [facility])   [place] [facility] Gasper Nancyann BRAVO, MD              Refused Prescriptions Disp Refills   pioglitazone  (ACTOS ) 30 MG tablet 90 tablet 1    Sig: TAKE 1 TABLET(30 MG) BY MOUTH DAILY     Endocrinology:  Diabetes - Glitazones - pioglitazone  Failed - 07/13/2024 11:15 AM      Failed - HBA1C is between 0 and 7.9 and within 180 days    Hemoglobin A1C  Date Value Ref Range Status  04/30/2024 11.4 (A) 4.0 - 5.6 % Final   Hgb A1c MFr Bld  Date Value Ref Range Status  02/01/2023 7.8 (H) 4.8 - 5.6 % Final     Comment:             Prediabetes: 5.7 - 6.4          Diabetes: >6.4          Glycemic control for adults with diabetes: <7.0          Passed - Valid encounter within last 6 months    Recent Outpatient Visits           4 weeks ago Diabetes mellitus without complication [facility])   [place] [facility] Gas[place] Nancyann BRAVO, MD   2 months ago Diabetes mellitus without complication [facility])   [place] [facility] Gasper Nancyann BRAVO, MD   4 months ago Diabetes mellitus without complication [facility])    [facility] Gasper Nancyann BRAVO, MD   6 months ago Abnormal weight loss   [facility] Gasper Nancyann BRAVO, MD   8 months ago  Diabetes mellitus without complication [facility])   [place] [facility] Gasper, Nancyann BRAVO, MD

## 2024-07-13 NOTE — Telephone Encounter (Signed)
 Called pharmacy - pt picked up a 30 day supply 06/03/2025. Pt has a refill available.

## 2024-07-27 ENCOUNTER — Ambulatory Visit: Admitting: Family Medicine
